# Patient Record
Sex: Female | Born: 1960 | Race: White | Hispanic: No | Marital: Married | Smoking: Former smoker
Health system: Southern US, Community
[De-identification: ages and names within clinical notes are randomized; demographics above are authoritative.]

## PROBLEM LIST (undated history)

## (undated) DIAGNOSIS — N946 Dysmenorrhea, unspecified: Secondary | ICD-10-CM

## (undated) DIAGNOSIS — M1990 Unspecified osteoarthritis, unspecified site: Secondary | ICD-10-CM

## (undated) DIAGNOSIS — G20 Parkinson's disease: Secondary | ICD-10-CM

## (undated) DIAGNOSIS — F419 Anxiety disorder, unspecified: Secondary | ICD-10-CM

## (undated) DIAGNOSIS — C029 Malignant neoplasm of tongue, unspecified: Secondary | ICD-10-CM

## (undated) DIAGNOSIS — E039 Hypothyroidism, unspecified: Secondary | ICD-10-CM

## (undated) DIAGNOSIS — M1712 Unilateral primary osteoarthritis, left knee: Secondary | ICD-10-CM

## (undated) DIAGNOSIS — G20A1 Parkinson's disease without dyskinesia, without mention of fluctuations: Secondary | ICD-10-CM

## (undated) DIAGNOSIS — S83209A Unspecified tear of unspecified meniscus, current injury, unspecified knee, initial encounter: Secondary | ICD-10-CM

## (undated) DIAGNOSIS — I10 Essential (primary) hypertension: Secondary | ICD-10-CM

## (undated) DIAGNOSIS — E7800 Pure hypercholesterolemia, unspecified: Secondary | ICD-10-CM

## (undated) HISTORY — DX: Pure hypercholesterolemia, unspecified: E78.00

## (undated) HISTORY — DX: Malignant neoplasm of tongue, unspecified: C02.9

## (undated) HISTORY — DX: Parkinson's disease without dyskinesia, without mention of fluctuations: G20.A1

## (undated) HISTORY — DX: Essential (primary) hypertension: I10

## (undated) HISTORY — DX: Anxiety disorder, unspecified: F41.9

## (undated) HISTORY — DX: Hypothyroidism, unspecified: E03.9

## (undated) HISTORY — DX: Dysmenorrhea, unspecified: N94.6

## (undated) HISTORY — PX: WISDOM TOOTH EXTRACTION: SHX21

## (undated) HISTORY — DX: Parkinson's disease: G20

## (undated) HISTORY — DX: Unspecified tear of unspecified meniscus, current injury, unspecified knee, initial encounter: S83.209A

---

## 2003-01-31 ENCOUNTER — Other Ambulatory Visit: Admission: RE | Admit: 2003-01-31 | Discharge: 2003-01-31 | Payer: Self-pay | Admitting: *Deleted

## 2004-02-06 ENCOUNTER — Other Ambulatory Visit: Admission: RE | Admit: 2004-02-06 | Discharge: 2004-02-06 | Payer: Self-pay | Admitting: *Deleted

## 2005-02-18 ENCOUNTER — Other Ambulatory Visit: Admission: RE | Admit: 2005-02-18 | Discharge: 2005-02-18 | Payer: Self-pay | Admitting: *Deleted

## 2006-03-17 ENCOUNTER — Other Ambulatory Visit: Admission: RE | Admit: 2006-03-17 | Discharge: 2006-03-17 | Payer: Self-pay | Admitting: Obstetrics & Gynecology

## 2007-03-30 ENCOUNTER — Other Ambulatory Visit: Admission: RE | Admit: 2007-03-30 | Discharge: 2007-03-30 | Payer: Self-pay | Admitting: Obstetrics and Gynecology

## 2007-05-18 ENCOUNTER — Ambulatory Visit: Payer: Self-pay | Admitting: Vascular Surgery

## 2008-04-11 ENCOUNTER — Other Ambulatory Visit: Admission: RE | Admit: 2008-04-11 | Discharge: 2008-04-11 | Payer: Self-pay | Admitting: Obstetrics and Gynecology

## 2009-10-30 ENCOUNTER — Encounter: Admission: RE | Admit: 2009-10-30 | Discharge: 2009-10-30 | Payer: Self-pay | Admitting: Gastroenterology

## 2011-02-12 NOTE — Procedures (Signed)
LOWER EXTREMITY VENOUS REFLUX EXAM   INDICATION:  Left leg varicose vein and pain.   EXAM:  Using color-flow imaging and pulse Doppler spectral analysis, the  left common femoral, superficial femoral, popliteal, posterior tibial,  greater and lesser saphenous veins are evaluated.  There is no evidence  suggesting deep venous insufficiency in the left lower extremity.   The left saphenofemoral junction is competent.  The left GSV is not  competent in the mid level with the caliber as described below.   The left proximal short saphenous vein demonstrates competency.   GSV Diameter (used if found to be incompetent only)                                            Right    Left  Proximal Greater Saphenous Vein           cm       0.58 cm  Proximal-to-mid-thigh                     cm       0.58 cm  Mid thigh                                 cm       0.36 cm  Mid-distal thigh                          cm       0.36 cm  Distal thigh                              cm       0.33 cm  Knee                                      cm       0.38 cm   IMPRESSION:  1. Left greater saphenous vein reflux is identified with a caliber      ranging from 0.38 to 0.58 cm knee to groin.  2. The left greater saphenous vein is not aneurysmal.  3. The left greater saphenous vein is not tortuous.  4. The deep system is competent.  5. The left lesser saphenous vein is competent.  6. No evidence of deep vein thrombosis noted in the left leg.   ___________________________________________  Quita Skye. Hart Rochester, M.D.   MG/MEDQ  D:  05/18/2007  T:  05/19/2007  Job:  [id number]

## 2011-02-12 NOTE — Consult Note (Signed)
VASCULAR SURGERY CONSULTATION   Abigail, Flores  DOB:  June 06, 1961                                       05/18/2007  YQMVH#:[id number]   Abigail Flores is a 50 year old female with a long history of progressive  varicose veins, particularly in the left leg, with a lesser degree on  the right side.  She has been having increasing symptoms including  throbbing, aching and burning and cramping discomfort in the left calf.  She has not worn elastic compression stockings, but occasionally will  elevate the legs with some improvement.  She does not take pain  medication on a regular basis.  She has no distal swelling or history of  thrombophlebitis, deep vein thrombosis, bleeding or ulceration, but is  having increasing discomfort in the leg which is affecting her daily  living.   PAST MEDICAL HISTORY:  Negative for diabetes, hypertension, coronary  artery disease, stroke, COPD.   PREVIOUS SURGERY:  None.   FAMILY HISTORY:  Negative for coronary artery disease and stroke, but  positive for diabetes in a grandmother.   SOCIAL HISTORY:  She is married and works actively.  She does not use  tobacco or alcohol.   ALLERGIES:  None known.   PHYSICAL EXAMINATION:  VITAL SIGNS:  Blood pressure 155/107, heart rate  is 97, respirations 16.  GENERAL:  She is a healthy-appearing middle-aged female in no apparent  distress.  She is alert and oriented x3.  NECK:  Supple with 3+ carotid pulses palpable.  No bruits are audible.  CHEST:  Clear to auscultation.  CARDIOVASCULAR:  Regular rhythm with no murmurs.  ABDOMEN:  Soft, nontender with no masses palpable.  EXTREMITIES:  She has 3+ femoral, popliteal and 2+ dorsalis pedis pulses  palpable bilaterally.  Left leg has typical greater saphenous  varicosities beginning below the knee, extending into the medical calf,  with multiple spider veins surrounding this in the distal thigh and  extending down the calf.  There is no distal edema,  no hyperpigmentation  or ulceration.  Right leg has diffuse spider veins in the thigh and calf  with no large varicosities noted.   Venous duplex exam revealed no reflux at the left saphenofemoral  junction, but some mild to moderate reflux in the mid left greater  saphenous vein communicating with these varicosities.   We will treat her initially with elastic compression stockings, pain  medications (ibuprofen) and elevation of the legs to see if this  improves her symptoms.  If not, I think the best plan would be to  proceed with primary stab phlebectomies of these varicosities with some  sclerotherapy of the secondary varicosities in the left calf.  The  saphenous vein does appear somewhat small for access, even though there  is reflux within it in the left thigh.   Abigail Flores, M.D.  Electronically Signed  JDL/MEDQ  D:  05/18/2007  T:  05/19/2007  Job:  [id number]   cc:   Abigail Flores

## 2012-09-30 HISTORY — PX: COLONOSCOPY: SHX174

## 2013-01-11 ENCOUNTER — Telehealth: Payer: Self-pay | Admitting: Certified Nurse Midwife

## 2013-01-11 NOTE — Telephone Encounter (Signed)
Patient states no menses for three months and wants to know if she should just discontinue birth control pills.  I advised patient she should have office visit first to review cycles and possibly wean off OCP to prevent menopausal symptoms. Patient reports she is already having hot flashes.  Appointment scheduled for 01-18-13, patient declined earlier appointment and to stay on OCP till appointment.  Agree?

## 2013-01-11 NOTE — Telephone Encounter (Signed)
yes

## 2013-01-11 NOTE — Telephone Encounter (Signed)
Patient wants to stop birth control pills but want to check with nurse first/[place]

## 2013-01-18 ENCOUNTER — Encounter: Payer: Self-pay | Admitting: Certified Nurse Midwife

## 2013-01-18 ENCOUNTER — Ambulatory Visit (INDEPENDENT_AMBULATORY_CARE_PROVIDER_SITE_OTHER): Payer: 59 | Admitting: Certified Nurse Midwife

## 2013-01-18 VITALS — BP 124/80 | HR 68 | Resp 16

## 2013-01-18 DIAGNOSIS — N912 Amenorrhea, unspecified: Secondary | ICD-10-CM

## 2013-01-18 DIAGNOSIS — N951 Menopausal and female climacteric states: Secondary | ICD-10-CM

## 2013-01-18 LAB — POCT URINE PREGNANCY: Preg Test, Ur: NEGATIVE

## 2013-01-18 NOTE — Progress Notes (Signed)
52 yo mwf g1p0010 here with complaint of hot flashes and night sweats off and on. Denies insomnia, vaginal dryness or weight gain.  Currently on LoLoestrin Fe for contraception with scant blood noted on placebo days. Patient not sure last time she actually had menses like bleeding. Patient came in today worried she should stop her pills. Patient hypothyroid on stable medication. "Friend said she was in menopause and frightened me" Feels well, no health issues.  O: Healthy female, WD WN Aex 05-11-12 normal, with normal pap smear,HPVHR not detected.  Last mammogram 12-13 normal UPT negative  Pelvic exam:External genital area: normal no lesions BUS: negative Urethra/urethral meatus/bladder: non tender Vagina: moist, normal appearance Cervix:normal appearance, non tender Uterus: normal, nontender Adnexa: non tender, no masses, normal Perineal area: normal no lesions   A: Perimenopausal on LoLoestrinFe with amenorrhea  Reviewed, TL  Negative UPT( patient request) Normal pelvic exam  P: Discussed etiology of perimenopause, bleeding expectations with perimenopause and with OCP use. Questions addressed at length with handout reviewed. Discussed that the LoLoestrin Fe is providing some hormonal support for changes she is experiencing, but she can stop if she desires so we can draw a FSH level. Would continued condom use for protection until result back. Discussed risks and benefits of HRT if menopausal range and desires use.  Patient would like to continue OCP at this point, feels better on.  Has aex in 7-14 will re-evaluate then unless changes.  Given information on HRT and menopause. Reassured normal pelvic exam.  30 minutes spent with patient with >50% of time spent in face to face counseling.  Rv prn and aex

## 2013-02-05 ENCOUNTER — Telehealth: Payer: Self-pay | Admitting: *Deleted

## 2013-02-05 NOTE — Telephone Encounter (Signed)
Patient called to inquire and request she would like to stop her current birth control . Lo Estrin. Seen 2 weeks ago with Dr. Hyacinth Meeker. Patient very concern is gaining weight no matter how she diets or exorcize and was hoping this might help also. Please advise. sue

## 2013-02-05 NOTE — Telephone Encounter (Signed)
Pt saw DL at last visit.  It is fine to stop her OCPs.  She needs to use condoms for birth control until she can have an FSH drawn.  I would wait at least two weeks after stopping pills before drawing the [facility]  If she is going to do this, will you let me know and I will put in the order.  The weight gain may be due to menopause and stopping the pills won't make that much difference but I am fine if she stops.  Just use condoms.

## 2013-02-05 NOTE — Telephone Encounter (Signed)
Patient notified of your response . Will talk this over with her husband and let us know on Monday what she decides to do. She mostly is concerned of the weight issue and will call back on Monday.

## 2013-02-08 ENCOUNTER — Telehealth: Payer: Self-pay | Admitting: *Deleted

## 2013-02-08 DIAGNOSIS — N951 Menopausal and female climacteric states: Secondary | ICD-10-CM

## 2013-02-08 NOTE — Telephone Encounter (Signed)
PATIENT CALLED TO GIVE ANSWER FROM PHONE CONVERSATION LAST WEEK. HAS DECIDED TO STOP CURRENT BIRTH CONTROL AFTER FINISHING THE PACK SHE HAS NOW. UNDERSTANDS TO USE CONDOMS AND AGREES. WILL CALL IN 2 WEEKS TO SCHEDULE LAB TEST FSH TO BE DONE. [name]

## 2013-02-08 NOTE — Telephone Encounter (Signed)
Order entered for [facility]

## 2013-03-15 ENCOUNTER — Telehealth: Payer: Self-pay

## 2013-03-15 NOTE — Telephone Encounter (Signed)
Patient was called to see if she had actually stopped her oc's & if so when she wanted to schedule to have [facility] checked. Patient says it has been 2 weeks since she stopped her pills. She is in [employer] right now & said she would like to have it checked at [facility] or somewhere in [place] if possible. She said as soon as she gets back that she will call us & give [place] the name & fax number as to where she would like to have the order sent

## 2013-03-22 ENCOUNTER — Ambulatory Visit (INDEPENDENT_AMBULATORY_CARE_PROVIDER_SITE_OTHER): Payer: 59 | Admitting: Obstetrics & Gynecology

## 2013-03-22 DIAGNOSIS — N951 Menopausal and female climacteric states: Secondary | ICD-10-CM

## 2013-03-22 DIAGNOSIS — Z0000 Encounter for general adult medical examination without abnormal findings: Secondary | ICD-10-CM

## 2013-03-25 LAB — FOLLICLE STIMULATING HORMONE: FSH: 49.4 m[IU]/mL

## 2013-05-12 ENCOUNTER — Encounter: Payer: Self-pay | Admitting: Certified Nurse Midwife

## 2013-05-17 ENCOUNTER — Encounter: Payer: Self-pay | Admitting: Certified Nurse Midwife

## 2013-05-17 ENCOUNTER — Ambulatory Visit (INDEPENDENT_AMBULATORY_CARE_PROVIDER_SITE_OTHER): Payer: 59 | Admitting: Certified Nurse Midwife

## 2013-05-17 VITALS — BP 120/76 | HR 68 | Resp 16 | Ht 64.5 in | Wt 190.0 lb

## 2013-05-17 DIAGNOSIS — Z1211 Encounter for screening for malignant neoplasm of colon: Secondary | ICD-10-CM

## 2013-05-17 DIAGNOSIS — Z01419 Encounter for gynecological examination (general) (routine) without abnormal findings: Secondary | ICD-10-CM

## 2013-05-17 DIAGNOSIS — Z0000 Encounter for general adult medical examination without abnormal findings: Secondary | ICD-10-CM

## 2013-05-17 LAB — POCT URINALYSIS DIPSTICK
Bilirubin, UA: NEGATIVE
Ketones, UA: NEGATIVE
Nitrite, UA: NEGATIVE
Protein, UA: NEGATIVE
pH, UA: 5

## 2013-05-17 NOTE — Patient Instructions (Signed)
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
## 2013-05-20 NOTE — Progress Notes (Signed)
Note reviewed, agree with plan.  Cache Bills, MD  

## 2013-06-11 ENCOUNTER — Other Ambulatory Visit: Payer: Self-pay | Admitting: Certified Nurse Midwife

## 2013-06-11 ENCOUNTER — Telehealth: Payer: Self-pay

## 2013-06-11 DIAGNOSIS — Z1211 Encounter for screening for malignant neoplasm of colon: Secondary | ICD-10-CM

## 2013-06-11 LAB — FECAL OCCULT BLOOD, IMMUNOCHEMICAL: Fecal Occult Blood: NEGATIVE

## 2013-06-11 NOTE — Telephone Encounter (Signed)
IFOB test neg. Pt notified

## 2013-06-11 NOTE — Addendum Note (Signed)
Addended by: Eliezer Bottom on: 06/11/2013 10:59 AM   Modules accepted: Orders

## 2014-05-23 ENCOUNTER — Ambulatory Visit: Payer: 59 | Admitting: Certified Nurse Midwife

## 2014-06-20 ENCOUNTER — Encounter: Payer: Self-pay | Admitting: Certified Nurse Midwife

## 2014-06-20 ENCOUNTER — Ambulatory Visit (INDEPENDENT_AMBULATORY_CARE_PROVIDER_SITE_OTHER): Payer: 59 | Admitting: Certified Nurse Midwife

## 2014-06-20 VITALS — BP 118/80 | HR 70 | Resp 16 | Ht 64.25 in | Wt 192.0 lb

## 2014-06-20 DIAGNOSIS — Z0000 Encounter for general adult medical examination without abnormal findings: Secondary | ICD-10-CM

## 2014-06-20 DIAGNOSIS — E039 Hypothyroidism, unspecified: Secondary | ICD-10-CM | POA: Insufficient documentation

## 2014-06-20 DIAGNOSIS — Z01419 Encounter for gynecological examination (general) (routine) without abnormal findings: Secondary | ICD-10-CM

## 2014-06-20 DIAGNOSIS — I10 Essential (primary) hypertension: Secondary | ICD-10-CM | POA: Insufficient documentation

## 2014-06-20 DIAGNOSIS — Z124 Encounter for screening for malignant neoplasm of cervix: Secondary | ICD-10-CM

## 2014-06-20 LAB — POCT URINALYSIS DIPSTICK
BILIRUBIN UA: NEGATIVE
Glucose, UA: NEGATIVE
Ketones, UA: NEGATIVE
Leukocytes, UA: NEGATIVE
Nitrite, UA: NEGATIVE
PH UA: 5
PROTEIN UA: NEGATIVE
RBC UA: NEGATIVE
Urobilinogen, UA: NEGATIVE

## 2014-06-20 NOTE — Patient Instructions (Signed)

## 2014-06-20 NOTE — Progress Notes (Signed)
Reviewed personally.  M. Suzanne Akeen Ledyard, MD.  
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
## 2014-06-22 LAB — IPS PAP TEST WITH REFLEX TO HPV

## 2014-08-01 ENCOUNTER — Encounter: Payer: Self-pay | Admitting: Certified Nurse Midwife

## 2014-09-30 HISTORY — PX: LASIK: SHX215

## 2015-06-26 ENCOUNTER — Ambulatory Visit (INDEPENDENT_AMBULATORY_CARE_PROVIDER_SITE_OTHER): Payer: 59 | Admitting: Nurse Practitioner

## 2015-06-26 ENCOUNTER — Ambulatory Visit: Payer: 59 | Admitting: Certified Nurse Midwife

## 2015-06-26 ENCOUNTER — Encounter: Payer: Self-pay | Admitting: Nurse Practitioner

## 2015-06-26 VITALS — BP 136/90 | HR 84 | Ht 64.25 in | Wt 195.0 lb

## 2015-06-26 DIAGNOSIS — Z0000 Encounter for general adult medical examination without abnormal findings: Secondary | ICD-10-CM

## 2015-06-26 DIAGNOSIS — Z01419 Encounter for gynecological examination (general) (routine) without abnormal findings: Secondary | ICD-10-CM

## 2015-06-26 LAB — POCT URINALYSIS DIPSTICK
Bilirubin, UA: NEGATIVE
Blood, UA: NEGATIVE
GLUCOSE UA: NEGATIVE
KETONES UA: NEGATIVE
Leukocytes, UA: NEGATIVE
Nitrite, UA: NEGATIVE
Protein, UA: NEGATIVE
Urobilinogen, UA: NEGATIVE
pH, UA: 6
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
## 2015-06-26 NOTE — Patient Instructions (Signed)

## 2015-06-29 NOTE — Progress Notes (Signed)
Encounter reviewed by Dr. Brook Amundson C. Silva.  

## 2016-07-01 ENCOUNTER — Telehealth: Payer: Self-pay | Admitting: Nurse Practitioner

## 2016-07-01 ENCOUNTER — Ambulatory Visit: Payer: 59 | Admitting: Nurse Practitioner

## 2016-07-01 NOTE — Telephone Encounter (Signed)
Called but no voicemail set up to to reschedule an appointment today that was cancelled by the provider.

## 2016-07-22 ENCOUNTER — Ambulatory Visit: Payer: 59 | Admitting: Nurse Practitioner

## 2016-07-22 ENCOUNTER — Encounter: Payer: Self-pay | Admitting: Nurse Practitioner

## 2016-07-22 VITALS — BP 112/76 | HR 84 | Ht 65.0 in | Wt 185.0 lb

## 2016-07-22 DIAGNOSIS — Z0000 Encounter for general adult medical examination without abnormal findings: Secondary | ICD-10-CM

## 2016-07-22 DIAGNOSIS — Z01419 Encounter for gynecological examination (general) (routine) without abnormal findings: Secondary | ICD-10-CM | POA: Diagnosis not present

## 2016-07-22 LAB — POCT URINALYSIS DIPSTICK
BILIRUBIN UA: NEGATIVE
Glucose, UA: NEGATIVE
KETONES UA: NEGATIVE
LEUKOCYTES UA: NEGATIVE
Nitrite, UA: NEGATIVE
PH UA: 5
Protein, UA: NEGATIVE
RBC UA: NEGATIVE
Urobilinogen, UA: NEGATIVE
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
## 2016-07-22 NOTE — Patient Instructions (Signed)

## 2016-07-26 NOTE — Progress Notes (Signed)
Encounter reviewed by Dr. Brook Amundson C. Silva.  

## 2016-12-04 ENCOUNTER — Telehealth: Payer: Self-pay | Admitting: Certified Nurse Midwife

## 2016-12-05 ENCOUNTER — Encounter: Payer: Self-pay | Admitting: Certified Nurse Midwife

## 2016-12-05 ENCOUNTER — Telehealth: Payer: Self-pay | Admitting: Certified Nurse Midwife

## 2016-12-05 ENCOUNTER — Ambulatory Visit (INDEPENDENT_AMBULATORY_CARE_PROVIDER_SITE_OTHER): Payer: 59 | Admitting: Certified Nurse Midwife

## 2016-12-05 VITALS — BP 118/80 | HR 64 | Temp 97.6°F | Resp 16 | Ht 65.0 in | Wt 183.0 lb

## 2016-12-05 DIAGNOSIS — R319 Hematuria, unspecified: Secondary | ICD-10-CM | POA: Diagnosis not present

## 2016-12-05 DIAGNOSIS — B373 Candidiasis of vulva and vagina: Secondary | ICD-10-CM

## 2016-12-05 DIAGNOSIS — B3731 Acute candidiasis of vulva and vagina: Secondary | ICD-10-CM

## 2016-12-05 DIAGNOSIS — N390 Urinary tract infection, site not specified: Secondary | ICD-10-CM

## 2016-12-05 LAB — POCT URINALYSIS DIPSTICK
Bilirubin, UA: NEGATIVE
Glucose, UA: NEGATIVE
KETONES UA: NEGATIVE
Nitrite, UA: NEGATIVE
PROTEIN UA: NEGATIVE
UROBILINOGEN UA: NEGATIVE
pH, UA: 5

## 2016-12-05 MED ORDER — TERCONAZOLE 0.4 % VA CREA: 1.0000 | TOPICAL_CREAM | Freq: Every day | VAGINAL | 0 refills | Status: DC

## 2016-12-05 MED ORDER — PHENAZOPYRIDINE HCL 100 MG PO TABS: 100.0000 mg | ORAL_TABLET | Freq: Three times a day (TID) | ORAL | 0 refills | Status: DC | PRN

## 2016-12-05 NOTE — Telephone Encounter (Signed)
Her exam did not reveal any CVAT tenderness related to UTI. Will wait on urine micro to advise

## 2016-12-05 NOTE — Progress Notes (Signed)
Encounter reviewed Tyon Cerasoli, MD   

## 2016-12-05 NOTE — Telephone Encounter (Signed)
Spoke with patient, advised as seen below per Melvia Heaps, CNM. Patient states she has now taken 2 pyridium and is getting relief of symptoms. Patient verbalizes understanding and is agreeable.  Routing to provider for final review. Patient is agreeable to disposition. Will close encounter.

## 2016-12-05 NOTE — Telephone Encounter (Signed)
Spoke with patient. Patient states she was in office today for UTI symptoms. Patient states she forgot to mention to Melvia Heaps, CNM that she has also been experiencing lower back pain. Patient states the lower back pain started 3 weeks ago when urinary symptoms started. Patient denies fever, nausea or vomiting. Patient states she has started the pyridium. Advised patient RN would update Melvia Heaps, CNM and return call with any additional recommendations.   Melvia Heaps, CNM -please advise?

## 2016-12-05 NOTE — Progress Notes (Signed)
57 y.o. [name] G1P0010 here with complaint of UTI that was treated with Macrobid per PCP in [place] x one week,felt better and then sent specimen to PCP and specimen was negative per PCP.  New onset symptoms of burning 24 hours ago.  Patient complaining of urinary frequency/urgency/ and pain with urination. Patient denies fever, chills, nausea or back pain.Patient used douche vinegar yesterday and felt some better.. Patient feels not related to sexual activity.Some vaginal dryness.  Not drinking enough fluids mainly water. No other health issues. Has lost 18 pounds and wonders if this caused this.  ROS pertinent to HPI  O: Healthy female WDWN Affect: Normal, orientation x 3 Skin : warm and dry CVAT: negative bilateral Abdomen: negative for suprapubic tenderness  Pelvic exam: External genital area: normal, no lesions slight dryness noted Bladder,Urethra tender very slightly tender, Urethral meatus: tender,with increase pink Vagina: thick white non odorousl vaginal discharge, slight atrophic appearance  Wet prep taken, ph 4.0 Cervix: normal, non tender Uterus:normal,non tender Adnexa: normal non tender, no fullness or masses   A: Normal pelvic exam Yeast vaginitis R/O UTI, treated 2 weeks ago with Macrobid  Wet Prep: Koh, Saline + for yeast only poct urine- wbc 1+, rbc tr  P: Reviewed findings of yeast vaginitis and need for treatment. [id number] with instructions Discussed vaginal dryness can also increase risk of vaginal infection and UTI HKU:VJDYN micro, culture Reviewed warning signs and symptoms of UTI and need to advise if occurring.Encouraged to limit soda, tea, and coffee and be sure to increase water intake. Rx Pyridium see order with instructions Will treat if indicated.   RV prn

## 2016-12-05 NOTE — Telephone Encounter (Signed)
Patient called and requested to speak with the nurse. She was seen this morning and said, "I forgot to ask about my back pain at my visit this morning.  Could that be related with what's going on with me right now?"

## 2016-12-05 NOTE — Patient Instructions (Addendum)
Vaginal Yeast infection, Adult Vaginal yeast infection is a condition that causes soreness, swelling, and redness (inflammation) of the vagina. It also causes vaginal discharge. This is a common condition. Some women get this infection frequently. What are the causes? This condition is caused by a change in the normal balance of the yeast (candida) and bacteria that live in the vagina. This change causes an overgrowth of yeast, which causes the inflammation. What increases the risk? This condition is more likely to develop in:  Women who take antibiotic medicines.  Women who have diabetes.  Women who take birth control pills.  Women who are pregnant.  Women who douche often.  Women who have a weak defense (immune) system.  Women who have been taking steroid medicines for a long time.  Women who frequently wear tight clothing. What are the signs or symptoms? Symptoms of this condition include:  White, thick vaginal discharge.  Swelling, itching, redness, and irritation of the vagina. The lips of the vagina (vulva) may be affected as well.  Pain or a burning feeling while urinating.  Pain during sex. How is this diagnosed? This condition is diagnosed with a medical history and physical exam. This will include a pelvic exam. Your health care provider will examine a sample of your vaginal discharge under a microscope. Your health care provider may send this sample for testing to confirm the diagnosis. How is this treated? This condition is treated with medicine. Medicines may be over-the-counter or prescription. You may be told to use one or more of the following:  Medicine that is taken orally.  Medicine that is applied as a cream.  Medicine that is inserted directly into the vagina (suppository). Follow these instructions at home:  Take or apply over-the-counter and prescription medicines only as told by your health care provider.  Do not have sex until your health care  provider has approved. Tell your sex partner that you have a yeast infection. That person should go to his or her health care provider if he or she develops symptoms.  Do not wear tight clothes, such as pantyhose or tight pants.  Avoid using tampons until your health care provider approves.  Eat more yogurt. This may help to keep your yeast infection from returning.  Try taking a sitz bath to help with discomfort. This is a warm water bath that is taken while you are sitting down. The water should only come up to your hips and should cover your buttocks. Do this 3-4 times per day or as told by your health care provider.  Do not douche.  Wear breathable, cotton underwear.  If you have diabetes, keep your blood sugar levels under control. Contact a health care provider if:  You have a fever.  Your symptoms go away and then return.  Your symptoms do not get better with treatment.  Your symptoms get worse.  You have new symptoms.  You develop blisters in or around your vagina.  You have blood coming from your vagina and it is not your menstrual period.  You develop pain in your abdomen. This information is not intended to replace advice given to you by your health care provider. Make sure you discuss any questions you have with your health care provider. Document Released: 06/26/2005 Document Revised: 02/28/2016 Document Reviewed: 03/20/2015 Elsevier Interactive Patient Education  2017 [employer] Urinary Tract Infection, Adult A urinary tract infection (UTI) is an infection of any part of the urinary tract. The urinary tract includes the:  Kidneys.  Ureters.  Bladder.  Urethra. These organs make, store, and get rid of pee (urine) in the body. Follow these instructions at home:  Take over-the-counter and prescription medicines only as told by your doctor.  If you were prescribed an antibiotic medicine, take it as told by your doctor. Do not stop taking the antibiotic  even if you start to feel better.  Avoid the following drinks:  Alcohol.  Caffeine.  Tea.  Carbonated drinks.  Drink enough fluid to keep your pee clear or pale yellow.  Keep all follow-up visits as told by your doctor. This is important.  Make sure to:  Empty your bladder often and completely. Do not to hold pee for long periods of time.  Empty your bladder before and after sex.  Wipe from front to back after a bowel movement if you are female. Use each tissue one time when you wipe. Contact a doctor if:  You have back pain.  You have a fever.  You feel sick to your stomach (nauseous).  You throw up (vomit).  Your symptoms do not get better after 3 days.  Your symptoms go away and then come back. Get help right away if:  You have very bad back pain.  You have very bad lower belly (abdominal) pain.  You are throwing up and cannot keep down any medicines or water. This information is not intended to replace advice given to you by your health care provider. Make sure you discuss any questions you have with your health care provider. Document Released: 03/04/2008 Document Revised: 02/22/2016 Document Reviewed: 08/07/2015 Elsevier Interactive Patient Education  2017 [employer].

## 2016-12-06 LAB — URINALYSIS, MICROSCOPIC ONLY
CRYSTALS: NONE SEEN [HPF]
Casts: NONE SEEN [LPF]
YEAST: NONE SEEN [HPF]

## 2016-12-07 LAB — URINE CULTURE

## 2016-12-09 ENCOUNTER — Other Ambulatory Visit: Payer: Self-pay | Admitting: Nurse Practitioner

## 2016-12-09 MED ORDER — CIPROFLOXACIN HCL 500 MG PO TABS: 500.0000 mg | ORAL_TABLET | Freq: Two times a day (BID) | ORAL | 0 refills | Status: DC

## 2016-12-09 NOTE — Progress Notes (Signed)
cipro

## 2016-12-12 ENCOUNTER — Telehealth: Payer: Self-pay

## 2016-12-12 NOTE — Telephone Encounter (Signed)
Spoke with patient. See telephone encounter for 12-12-16

## 2016-12-12 NOTE — Telephone Encounter (Signed)
-----   Message from Regina Eck, CNM sent at 12/12/2016  8:20 AM EDT ----- Please call and check on patient UTI status due to reoccurrence from previous treatment with PCP

## 2016-12-12 NOTE — Telephone Encounter (Signed)
Pt unable to talk to me & asked me to call her back later.

## 2016-12-17 ENCOUNTER — Other Ambulatory Visit: Payer: Self-pay | Admitting: Nurse Practitioner

## 2016-12-17 NOTE — Telephone Encounter (Signed)
Medication refill request: Ciprofloxacin Last AEX:  07/22/16 PG Next AEX: 07/28/17 PG Last MMG (if hormonal medication request): 02/12/16 [id number], Density B, [facility] Refill authorized: 12/09/16 #14 0R. Spoke with patient. She states she got off schedule with medication, but has taken it. Still has some burning in lower area. Patient would like to see if another round of antibiotics will work.

## 2016-12-17 NOTE — Telephone Encounter (Signed)
Please call her and get information about how she is doing and if needs a repeat OV with DL or does she have vaginitis symptoms.

## 2016-12-18 ENCOUNTER — Telehealth: Payer: Self-pay | Admitting: Certified Nurse Midwife

## 2016-12-18 MED ORDER — FLUCONAZOLE 150 MG PO TABS: ORAL_TABLET | ORAL | 0 refills | Status: DC

## 2016-12-18 NOTE — Telephone Encounter (Signed)
Spoke with patient, advised as seen below per Melvia Heaps, CNM. Order placed for Diflucan at verified pharmacy on file. Patient verbalizes understanding and is agreeable.  Routing to provider for final review. Patient is agreeable to disposition. Will close encounter.

## 2016-12-18 NOTE — Telephone Encounter (Signed)
If she was not able to use Terazol she probably still has yeast, which is contributing symptoms.  She needs Diflucan 150 mg  Take one and repeat one in 5 days.. Will not give repeat antibiotics with out OV. But symptoms sound more vaginal, which is why I feel the Diflucan will help.

## 2016-12-18 NOTE — Telephone Encounter (Signed)
Spoke with patient. Patient asking to clarify directions for Diflucan. Advised patient to take one pill now, take second pill in 5 days. Patient verbalizes understanding and is agreeable.  Routing to provider for final review. Patient is agreeable to disposition. Will close encounter.

## 2016-12-18 NOTE — Telephone Encounter (Signed)
Spoke with patient. Patient states she is still having slight discomfort inside vagina, low abdomen. Patient states she is unable to describe the location, inside. Patient reports discomfort is better, but still there. Patient states she was hoping another round of antibiotics would help. Patient denies vaginal dryness, itching or discharge. Patient reports urinary frequency with small amounts of urine. Patient states she was unable to complete Terazol, too painful to insert. Patient denies fever, reports a little lower back pain, not as bad, hard to tell this early in day. Recommended OV for further evaluation, patient declined. Patient states only day off is Mondays and my husband just had knee surgery, I don't know when I could come in. Advised patient would update Abigail Flores, CNM and return call with any additional recommendations. Patient is agreeable.  Abigail Flores, CNM -any additional recommendations?  Cc: Kem Boroughs, NP

## 2016-12-18 NOTE — Telephone Encounter (Signed)
Patient called with more questions about her recent medication refill.

## 2017-02-17 ENCOUNTER — Encounter: Payer: Self-pay | Admitting: Obstetrics and Gynecology

## 2017-03-07 ENCOUNTER — Other Ambulatory Visit: Payer: Self-pay | Admitting: Certified Nurse Midwife

## 2017-03-07 ENCOUNTER — Telehealth: Payer: Self-pay | Admitting: *Deleted

## 2017-03-07 DIAGNOSIS — R319 Hematuria, unspecified: Principal | ICD-10-CM

## 2017-03-07 DIAGNOSIS — N390 Urinary tract infection, site not specified: Secondary | ICD-10-CM

## 2017-03-07 NOTE — Telephone Encounter (Addendum)
Medication refill request: pyridium  Last AEX:  07/22/16 DL Next AEX: 07/28/17 PG Last MMG (if hormonal medication request): 02/12/16 [id number]:neg  Refill authorized: 12/05/16 #10/0R. Today please advise.

## 2017-03-07 NOTE — Telephone Encounter (Signed)
Refill request received from pharmacy for pyridium. Unable to refill without evaluation per Melvia Heaps, CNM. Patient was just seen for UTI 11-2016. Call to patient. Reports frequency with minimal urine flow, and back pain for three days. Denies fever. Discussed recommendation to be evaluated at urgent care facility. Patient prefers to wait to se PCP on Monday. Advised that with back pain, this is not recommended, risk for progression to pyelo. Precautions given.  States back pain slightly better today. Encouraged to proceed to urgent care or [facility] if symptoms continue but if worsening back pain or fever should go to ED.  Encouraged to have notes sent to our office.   Routing to provider for final review. Patient agreeable to disposition. Will close encounter.

## 2017-03-17 ENCOUNTER — Ambulatory Visit (INDEPENDENT_AMBULATORY_CARE_PROVIDER_SITE_OTHER): Payer: 59 | Admitting: Nurse Practitioner

## 2017-03-17 ENCOUNTER — Encounter: Payer: Self-pay | Admitting: Nurse Practitioner

## 2017-03-17 VITALS — BP 136/82 | HR 68 | Temp 98.2°F | Ht 65.0 in | Wt 180.0 lb

## 2017-03-17 DIAGNOSIS — R300 Dysuria: Secondary | ICD-10-CM | POA: Diagnosis not present

## 2017-03-17 LAB — POCT URINALYSIS DIPSTICK
BILIRUBIN UA: NEGATIVE
GLUCOSE UA: NEGATIVE
Ketones, UA: NEGATIVE
NITRITE UA: NEGATIVE
UROBILINOGEN UA: 0.2 U/dL
pH, UA: 5 (ref 5.0–8.0)

## 2017-03-17 MED ORDER — SULFAMETHOXAZOLE-TRIMETHOPRIM 800-160 MG PO TABS: 1.0000 | ORAL_TABLET | Freq: Two times a day (BID) | ORAL | 0 refills | Status: DC

## 2017-03-17 NOTE — Progress Notes (Signed)
Reviewed personally.  M. Suzanne Jisselle Poth, MD.  

## 2017-03-17 NOTE — Patient Instructions (Signed)
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
## 2017-03-18 LAB — URINE CULTURE

## 2017-03-18 LAB — URINALYSIS, MICROSCOPIC ONLY
Bacteria, UA: NONE SEEN
Casts: NONE SEEN /lpf

## 2017-03-19 ENCOUNTER — Telehealth: Payer: Self-pay | Admitting: Nurse Practitioner

## 2017-03-19 NOTE — Telephone Encounter (Signed)
Patient returning your call.

## 2017-03-19 NOTE — Telephone Encounter (Signed)
Pt notified in result note.  Closing encounter. 

## 2017-03-20 ENCOUNTER — Telehealth: Payer: Self-pay | Admitting: Nurse Practitioner

## 2017-03-20 NOTE — Telephone Encounter (Signed)
Please call the patient back tomorrow to see if she is feeling better.

## 2017-03-20 NOTE — Telephone Encounter (Signed)
Stop the Bactrim.  There is a rare side effect of the Bactrim called rhabdomyolysis, muscle breakdown.  If she is experiencing severe pain, she needs to go to the emergency department for further evaluation including blood work and urine testing.  This is important to know if this is her diagnosis so she can receive proper care.  Please list the Bactrim as an allergy.

## 2017-03-20 NOTE — Telephone Encounter (Signed)
Spoke with patient, advised as seen below per Dr. Quincy Simmonds. Patient states she does not feel like the muscle pain is that bad to go to ER. RN strongly encouraged patient to f/u with ER for further evaluation of symptoms for appropriate diagnosis and treatment. Patient states she is thankful for recommendations and will f/u with any additional questions/concerns. Patient verbalizes understanding.  Allergies updated.  Dr. Quincy Simmonds -please review, any additional f/u?

## 2017-03-20 NOTE — Telephone Encounter (Signed)
Patient has a question for the nurse °

## 2017-03-20 NOTE — Telephone Encounter (Signed)
Spoke with patient. Patient states she was prescribed Bactrim DS bid x3 days for UTI on 6/18. Patient states she is on day 3, has one pill left to take this evening, completed Macrobid prior to [place]. Patient states she is experiencing excruciating muscle pain from antibiotic and would prefer not to take last dose. Patient states urinary symptoms have resolved. Advised patient Kem Boroughs, NP is out of the office today, will review with covering provider and return call with recommendations, patient is agreeable.   Dr. Quincy Simmonds -please review and advise?  Cc: Kem Boroughs, NP

## 2017-03-21 NOTE — Telephone Encounter (Signed)
Spoke with patient. Patient states she feels 100% better today, no muscle aches. Patient thankful for f/u call.   Routing to provider for final review. Patient is agreeable to disposition. Will close encounter.

## 2017-03-21 NOTE — Telephone Encounter (Signed)
Left message to call Kamara Allan at [phone].  

## 2017-07-28 ENCOUNTER — Ambulatory Visit: Payer: 59 | Admitting: Nurse Practitioner

## 2017-08-11 ENCOUNTER — Encounter: Payer: Self-pay | Admitting: Obstetrics and Gynecology

## 2017-08-11 ENCOUNTER — Ambulatory Visit: Payer: 59 | Admitting: Obstetrics and Gynecology

## 2017-08-11 VITALS — BP 130/80 | HR 84 | Resp 12 | Ht 63.25 in | Wt 182.0 lb

## 2017-08-11 DIAGNOSIS — Z01419 Encounter for gynecological examination (general) (routine) without abnormal findings: Secondary | ICD-10-CM

## 2017-08-11 DIAGNOSIS — E039 Hypothyroidism, unspecified: Secondary | ICD-10-CM | POA: Insufficient documentation

## 2017-08-11 DIAGNOSIS — Z124 Encounter for screening for malignant neoplasm of cervix: Secondary | ICD-10-CM

## 2017-08-11 DIAGNOSIS — E038 Other specified hypothyroidism: Secondary | ICD-10-CM

## 2017-08-11 NOTE — Patient Instructions (Signed)
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
## 2017-08-13 ENCOUNTER — Other Ambulatory Visit (HOSPITAL_COMMUNITY)
Admission: RE | Admit: 2017-08-13 | Discharge: 2017-08-13 | Disposition: A | Discharge status: Home / Self Care | Payer: 59 | Source: Ambulatory Visit | Attending: Obstetrics and Gynecology | Admitting: Obstetrics and Gynecology

## 2017-08-13 DIAGNOSIS — Z124 Encounter for screening for malignant neoplasm of cervix: Secondary | ICD-10-CM | POA: Insufficient documentation

## 2017-08-13 NOTE — Addendum Note (Signed)
Addended by: Dorothy Spark on: 08/13/2017 05:06 PM   Modules accepted: Orders

## 2017-08-15 LAB — CYTOLOGY - PAP
DIAGNOSIS: NEGATIVE
HPV: NOT DETECTED

## 2017-09-22 ENCOUNTER — Other Ambulatory Visit: Payer: Self-pay | Admitting: Orthopedic Surgery

## 2017-09-22 NOTE — Pre-Procedure Instructions (Signed)
PAILYNN VAHEY  09/22/2017      [id number] [place] [id number] Phone: [phone] Fax: [phone]    Your procedure is scheduled on Tuesday, January 8.  Report to [facility] Admitting at 11:15  A.M.               Your surgery or procedure is scheduled for 1:15 PM   Call this number if you have problems the morning of surgery: [phone]  - pre- op desk              For any other questions, please call [phone], Monday - Friday 8 AM - 4 PM. (except Jan 1st)            Remember:            DO NOT eat food or drink liquids after midnight Monday, January 8.               Take these medications with A SIP OF WATER: amLODipine (NORVASC) levothyroxine (SYNTHROID, LEVOTHROID)  May take HYDROcodone-acetaminophen (NORCO/VICODIN) if needed.  1 Week prior to surgery DO NOT  Take Aspirin, Aspirin Products (Goody Powder, Excedrin Migraine), Ibuprofen (Advil), Naproxen (Aleve), Viiamins and Herbal Products (ie Fish Oil)  Special instructions:   [facility]- Preparing For Surgery  Before surgery, you can play an important role. Because skin is not sterile, your skin needs to be as free of germs as possible. You can reduce the number of germs on your skin by washing with CHG (chlorahexidine gluconate) Soap before surgery.  CHG is an antiseptic cleaner which kills germs and bonds with the skin to continue killing germs even after washing.  Please do not use if you have an allergy to CHG or antibacterial soaps. If your skin becomes reddened/irritated stop using the CHG.  Do not shave (including legs and underarms) for at least 48 hours prior to first CHG shower. It is OK to shave your face.  Please follow these instructions carefully.   1. Shower the NIGHT BEFORE SURGERY and the MORNING OF SURGERY with CHG.   2. If you chose to wash your hair, wash your hair first as usual with your normal shampoo.  3. After  you shampoo, rinse your hair and body thoroughly to remove the shampoo.      Wash your face and private area with the soap you use at home, then rinse.  4.    Use CHG as you would any other liquid soap. You can apply CHG directly to the skin and wash gently with a scrungie or a clean washcloth.   5. Apply the CHG Soap to your body ONLY FROM THE NECK DOWN.  Do not use on open wounds or open sores. Avoid contact with your eyes, ears, mouth and genitals (private parts). Wash Face and genitals (private parts)  with your normal soap.  6. Wash thoroughly, paying special attention to the area where your surgery will be performed.  7. Thoroughly rinse your body with warm water from the neck down.  8. DO NOT shower/wash with your normal soap after using and rinsing off the CHG Soap.  9. Pat yourself dry with a CLEAN TOWEL.  10. Wear CLEAN PAJAMAS to bed the night before surgery, wear comfortable clothes the morning of surgery  11. Place CLEAN SHEETS on your bed the night of your first shower and DO NOT SLEEP WITH PETS.  Day of Surgery:   Shower as above Do not apply any deodorants/lotions, powders or colognes. Please wear clean clothes to the hospital/surgery center.  Do not wear jewelry, make-up or nail polish.  Do not shave 48 hours prior to surgery.  Men may shave face and neck.  Do not bring valuables to the hospital.  [facility] is not responsible for any belongings or valuables.  Contacts, dentures or bridgework may not be worn into surgery.  Leave your suitcase in the car.  After surgery it may be brought to your room.  For patients admitted to the hospital, discharge time will be determined by your treatment team.  Patients discharged the day of surgery will not be allowed to drive home.   Please read over the following fact sheets that you were given: Pain Booklet, Patient Instructions for Mupirocin Application,COughing and Deep Breathing, Surgical Site Infections.

## 2017-09-24 ENCOUNTER — Other Ambulatory Visit: Payer: Self-pay

## 2017-09-24 ENCOUNTER — Encounter (HOSPITAL_COMMUNITY)
Admission: RE | Admit: 2017-09-24 | Discharge: 2017-09-24 | Disposition: A | Discharge status: Home / Self Care | Payer: 59 | Source: Ambulatory Visit | Attending: Orthopedic Surgery | Admitting: Orthopedic Surgery

## 2017-09-24 ENCOUNTER — Encounter (HOSPITAL_COMMUNITY): Payer: Self-pay

## 2017-09-24 DIAGNOSIS — M13862 Other specified arthritis, left knee: Secondary | ICD-10-CM | POA: Insufficient documentation

## 2017-09-24 DIAGNOSIS — Z01810 Encounter for preprocedural cardiovascular examination: Secondary | ICD-10-CM | POA: Insufficient documentation

## 2017-09-24 DIAGNOSIS — Z01812 Encounter for preprocedural laboratory examination: Secondary | ICD-10-CM | POA: Diagnosis not present

## 2017-09-24 HISTORY — DX: Unspecified osteoarthritis, unspecified site: M19.90

## 2017-09-24 LAB — CBC
HCT: 39.5 % (ref 36.0–46.0)
HEMOGLOBIN: 12.9 g/dL (ref 12.0–15.0)
MCH: 26.8 pg (ref 26.0–34.0)
MCHC: 32.7 g/dL (ref 30.0–36.0)
MCV: 82.1 fL (ref 78.0–100.0)
Platelets: 403 10*3/uL — ABNORMAL HIGH (ref 150–400)
RBC: 4.81 MIL/uL (ref 3.87–5.11)
RDW: 14.1 % (ref 11.5–15.5)
WBC: 6.4 10*3/uL (ref 4.0–10.5)

## 2017-09-24 LAB — BASIC METABOLIC PANEL
ANION GAP: 7 (ref 5–15)
BUN: 10 mg/dL (ref 6–20)
CALCIUM: 9.8 mg/dL (ref 8.9–10.3)
CO2: 26 mmol/L (ref 22–32)
Chloride: 104 mmol/L (ref 101–111)
Creatinine, Ser: 0.61 mg/dL (ref 0.44–1.00)
GLUCOSE: 95 mg/dL (ref 65–99)
Potassium: 4.3 mmol/L (ref 3.5–5.1)
Sodium: 137 mmol/L (ref 135–145)

## 2017-09-24 LAB — SURGICAL PCR SCREEN
MRSA, PCR: NEGATIVE
STAPHYLOCOCCUS AUREUS: NEGATIVE

## 2017-09-24 NOTE — Pre-Procedure Instructions (Signed)
JONITA HIROTA  09/24/2017      [id number] [place] - [place] [address] Phone: [phone] Fax: [phone]    Your procedure is scheduled on Tuesday, January 8.  Report to [facility] Admitting at 11:15  A.M.               Your surgery or procedure is scheduled for 1:15 PM   Call this number if you have problems the morning of surgery: [phone]  - pre- op desk              For any other questions, please call [phone], Monday - Friday 8 AM - 4 PM. (except Jan 1st)            Remember:            DO NOT eat food or drink liquids after midnight Monday, January 8.               Take these medications with A SIP OF WATER: amLODipine (NORVASC) levothyroxine (SYNTHROID, LEVOTHROID)  May take HYDROcodone-acetaminophen (NORCO/VICODIN) if needed.  1 Week prior to surgery DO NOT  Take Aspirin, Aspirin Products (Goody Powder, Excedrin Migraine), Ibuprofen (Advil), Naproxen (Aleve), Viiamins and Herbal Products (ie Fish Oil)  Special instructions:   [facility]- Preparing For Surgery  Before surgery, you can play an important role. Because skin is not sterile, your skin needs to be as free of germs as possible. You can reduce the number of germs on your skin by washing with CHG (chlorahexidine gluconate) Soap before surgery.  CHG is an antiseptic cleaner which kills germs and bonds with the skin to continue killing germs even after washing.  Please do not use if you have an allergy to CHG or antibacterial soaps. If your skin becomes reddened/irritated stop using the CHG.  Do not shave (including legs and underarms) for at least 48 hours prior to first CHG shower. It is OK to shave your face.  Please follow these instructions carefully.   1. Shower the NIGHT BEFORE SURGERY and the MORNING OF SURGERY with CHG.   2. If you chose to wash your hair, wash your hair first as usual with your normal shampoo.  3. After  you shampoo, rinse your hair and body thoroughly to remove the shampoo.      Wash your face and private area with the soap you use at home, then rinse.  4.    Use CHG as you would any other liquid soap. You can apply CHG directly to the skin and wash gently with a scrungie or a clean washcloth.   5. Apply the CHG Soap to your body ONLY FROM THE NECK DOWN.  Do not use on open wounds or open sores. Avoid contact with your eyes, ears, mouth and genitals (private parts). Wash Face and genitals (private parts)  with your normal soap.  6. Wash thoroughly, paying special attention to the area where your surgery will be performed.  7. Thoroughly rinse your body with warm water from the neck down.  8. DO NOT shower/wash with your normal soap after using and rinsing off the CHG Soap.  9. Pat yourself dry with a CLEAN TOWEL.  10. Wear CLEAN PAJAMAS to bed the night before surgery, wear comfortable clothes the morning of surgery  11. Place CLEAN SHEETS on your bed the night of your first shower and DO NOT SLEEP WITH PETS.  Day of Surgery:   Shower as above Do not apply any deodorants/lotions, powders or colognes. Please wear clean clothes to the hospital/surgery center.  Do not wear jewelry, make-up or nail polish.  Do not shave 48 hours prior to surgery.    Do not bring valuables to the hospital.  [facility] is not responsible for any belongings or valuables.  Contacts, dentures or bridgework may not be worn into surgery.  Leave your suitcase in the car.  After surgery it may be brought to your room.  For patients admitted to the hospital, discharge time will be determined by your treatment team.  Patients discharged the day of surgery will not be allowed to drive home.   Please read over the following fact sheets that you were given: Pain Booklet, Patient Instructions for Mupirocin Application,COughing and Deep Breathing, Surgical Site Infections.

## 2017-09-26 ENCOUNTER — Other Ambulatory Visit: Payer: Self-pay | Admitting: Orthopedic Surgery

## 2017-10-07 ENCOUNTER — Observation Stay (HOSPITAL_COMMUNITY)
Admission: AD | Admit: 2017-10-07 | Discharge: 2017-10-08 | Disposition: A | Discharge status: Home / Self Care | Payer: 59 | Source: Ambulatory Visit | Attending: Orthopedic Surgery | Admitting: Orthopedic Surgery

## 2017-10-07 ENCOUNTER — Ambulatory Visit (HOSPITAL_COMMUNITY): Payer: 59 | Admitting: Certified Registered"

## 2017-10-07 ENCOUNTER — Encounter (HOSPITAL_COMMUNITY)
Admission: AD | Disposition: A | Discharge status: Home / Self Care | Payer: Self-pay | Source: Ambulatory Visit | Attending: Orthopedic Surgery

## 2017-10-07 ENCOUNTER — Inpatient Hospital Stay (HOSPITAL_COMMUNITY): Payer: 59

## 2017-10-07 ENCOUNTER — Encounter (HOSPITAL_COMMUNITY): Payer: Self-pay | Admitting: Certified Registered"

## 2017-10-07 DIAGNOSIS — I10 Essential (primary) hypertension: Secondary | ICD-10-CM | POA: Insufficient documentation

## 2017-10-07 DIAGNOSIS — E7800 Pure hypercholesterolemia, unspecified: Secondary | ICD-10-CM | POA: Insufficient documentation

## 2017-10-07 DIAGNOSIS — Z96659 Presence of unspecified artificial knee joint: Secondary | ICD-10-CM

## 2017-10-07 DIAGNOSIS — Z96652 Presence of left artificial knee joint: Secondary | ICD-10-CM

## 2017-10-07 DIAGNOSIS — M1712 Unilateral primary osteoarthritis, left knee: Principal | ICD-10-CM | POA: Insufficient documentation

## 2017-10-07 DIAGNOSIS — Z87891 Personal history of nicotine dependence: Secondary | ICD-10-CM | POA: Insufficient documentation

## 2017-10-07 DIAGNOSIS — E039 Hypothyroidism, unspecified: Secondary | ICD-10-CM | POA: Diagnosis not present

## 2017-10-07 DIAGNOSIS — Z79899 Other long term (current) drug therapy: Secondary | ICD-10-CM | POA: Diagnosis not present

## 2017-10-07 HISTORY — DX: Unilateral primary osteoarthritis, left knee: M17.12

## 2017-10-07 HISTORY — PX: TOTAL KNEE ARTHROPLASTY: SHX125

## 2017-10-07 SURGERY — ARTHROPLASTY, KNEE, TOTAL
Anesthesia: Spinal | Laterality: Left

## 2017-10-07 MED ORDER — RIVAROXABAN 10 MG PO TABS: 10.0000 mg | ORAL_TABLET | Freq: Every day | ORAL | 0 refills | Status: DC

## 2017-10-07 MED ORDER — DOCUSATE SODIUM 100 MG PO CAPS
100.0000 mg | ORAL_CAPSULE | Freq: Two times a day (BID) | ORAL | Status: DC
  Administered 2017-10-07 – 2017-10-08 (×2): 100 mg via ORAL
  Filled 2017-10-07 (×2): qty 1

## 2017-10-07 MED ORDER — CEFAZOLIN SODIUM-DEXTROSE 2-4 GM/100ML-% IV SOLN
2.0000 g | Freq: Four times a day (QID) | INTRAVENOUS | Status: AC
  Administered 2017-10-07 – 2017-10-08 (×2): 2 g via INTRAVENOUS
  Filled 2017-10-07 (×2): qty 100

## 2017-10-07 MED ORDER — SODIUM CHLORIDE 0.9 % IR SOLN
Status: DC | PRN
  Administered 2017-10-07: 1000 mL

## 2017-10-07 MED ORDER — ONDANSETRON HCL 4 MG/2ML IJ SOLN: 4.0000 mg | Freq: Four times a day (QID) | INTRAMUSCULAR | Status: DC | PRN

## 2017-10-07 MED ORDER — DEXAMETHASONE SODIUM PHOSPHATE 10 MG/ML IJ SOLN
INTRAMUSCULAR | Status: DC | PRN
  Administered 2017-10-07: 10 mg via INTRAVENOUS

## 2017-10-07 MED ORDER — PHENYLEPHRINE HCL 10 MG/ML IJ SOLN
INTRAVENOUS | Status: DC | PRN
  Administered 2017-10-07: 30 ug/min via INTRAVENOUS

## 2017-10-07 MED ORDER — PROPOFOL 10 MG/ML IV BOLUS
INTRAVENOUS | Status: AC
  Filled 2017-10-07: qty 20

## 2017-10-07 MED ORDER — OXYCODONE-ACETAMINOPHEN 10-325 MG PO TABS: 1.0000 | ORAL_TABLET | Freq: Four times a day (QID) | ORAL | 0 refills | Status: DC | PRN

## 2017-10-07 MED ORDER — METHOCARBAMOL 1000 MG/10ML IJ SOLN: 500.0000 mg | Freq: Four times a day (QID) | INTRAMUSCULAR | Status: DC | PRN

## 2017-10-07 MED ORDER — MIDAZOLAM HCL 2 MG/2ML IJ SOLN
INTRAMUSCULAR | Status: DC | PRN
  Administered 2017-10-07 (×2): 1 mg via INTRAVENOUS

## 2017-10-07 MED ORDER — CHLORHEXIDINE GLUCONATE 4 % EX LIQD: 60.0000 mL | Freq: Once | CUTANEOUS | Status: DC

## 2017-10-07 MED ORDER — AMLODIPINE BESYLATE 10 MG PO TABS
10.0000 mg | ORAL_TABLET | Freq: Every day | ORAL | Status: DC
  Administered 2017-10-08: 10 mg via ORAL
  Filled 2017-10-07: qty 1

## 2017-10-07 MED ORDER — BACLOFEN 10 MG PO TABS: 10.0000 mg | ORAL_TABLET | Freq: Three times a day (TID) | ORAL | 0 refills | Status: DC

## 2017-10-07 MED ORDER — HYDROMORPHONE HCL 1 MG/ML IJ SOLN: 0.5000 mg | INTRAMUSCULAR | Status: DC | PRN

## 2017-10-07 MED ORDER — ONDANSETRON HCL 4 MG PO TABS: 4.0000 mg | ORAL_TABLET | Freq: Three times a day (TID) | ORAL | 0 refills | Status: DC | PRN

## 2017-10-07 MED ORDER — METHOCARBAMOL 500 MG PO TABS
500.0000 mg | ORAL_TABLET | Freq: Four times a day (QID) | ORAL | Status: DC | PRN
  Administered 2017-10-07 – 2017-10-08 (×3): 500 mg via ORAL
  Filled 2017-10-07 (×3): qty 1

## 2017-10-07 MED ORDER — OXYCODONE HCL 5 MG PO TABS: 5.0000 mg | ORAL_TABLET | ORAL | Status: DC | PRN

## 2017-10-07 MED ORDER — KETOROLAC TROMETHAMINE 15 MG/ML IJ SOLN
7.5000 mg | Freq: Four times a day (QID) | INTRAMUSCULAR | Status: DC
  Administered 2017-10-07 – 2017-10-08 (×3): 7.5 mg via INTRAVENOUS
  Filled 2017-10-07 (×4): qty 1

## 2017-10-07 MED ORDER — ACETAMINOPHEN 650 MG RE SUPP: 650.0000 mg | RECTAL | Status: DC | PRN

## 2017-10-07 MED ORDER — BUPIVACAINE HCL (PF) 0.25 % IJ SOLN
INTRAMUSCULAR | Status: AC
  Filled 2017-10-07: qty 30

## 2017-10-07 MED ORDER — FENTANYL CITRATE (PF) 100 MCG/2ML IJ SOLN
INTRAMUSCULAR | Status: AC
  Administered 2017-10-07: 50 ug via INTRAVENOUS
  Filled 2017-10-07: qty 2

## 2017-10-07 MED ORDER — MIDAZOLAM HCL 2 MG/2ML IJ SOLN
INTRAMUSCULAR | Status: AC
  Filled 2017-10-07: qty 2

## 2017-10-07 MED ORDER — RIVAROXABAN 10 MG PO TABS
10.0000 mg | ORAL_TABLET | Freq: Every day | ORAL | Status: DC
  Administered 2017-10-08: 10 mg via ORAL
  Filled 2017-10-07: qty 1

## 2017-10-07 MED ORDER — SENNA 8.6 MG PO TABS
1.0000 | ORAL_TABLET | Freq: Two times a day (BID) | ORAL | Status: DC
  Administered 2017-10-07: 8.6 mg via ORAL
  Filled 2017-10-07: qty 1

## 2017-10-07 MED ORDER — GLYCOPYRROLATE 0.2 MG/ML IV SOSY
PREFILLED_SYRINGE | INTRAVENOUS | Status: DC | PRN
  Administered 2017-10-07: .2 mg via INTRAVENOUS

## 2017-10-07 MED ORDER — HYDROMORPHONE HCL 1 MG/ML IJ SOLN: 0.2500 mg | INTRAMUSCULAR | Status: DC | PRN

## 2017-10-07 MED ORDER — ONDANSETRON HCL 4 MG PO TABS: 4.0000 mg | ORAL_TABLET | Freq: Four times a day (QID) | ORAL | Status: DC | PRN

## 2017-10-07 MED ORDER — MIDAZOLAM HCL 2 MG/2ML IJ SOLN
INTRAMUSCULAR | Status: AC
  Administered 2017-10-07: 2 mg via INTRAVENOUS
  Filled 2017-10-07: qty 2

## 2017-10-07 MED ORDER — MAGNESIUM CITRATE PO SOLN: 1.0000 | Freq: Once | ORAL | Status: DC | PRN

## 2017-10-07 MED ORDER — MEPERIDINE HCL 25 MG/ML IJ SOLN: 6.2500 mg | INTRAMUSCULAR | Status: DC | PRN

## 2017-10-07 MED ORDER — SODIUM CHLORIDE 0.9 % IV SOLN: Freq: Once | INTRAVENOUS | Status: DC

## 2017-10-07 MED ORDER — ACETAMINOPHEN 325 MG PO TABS: 650.0000 mg | ORAL_TABLET | ORAL | Status: DC | PRN

## 2017-10-07 MED ORDER — METHOCARBAMOL 500 MG PO TABS
ORAL_TABLET | ORAL | Status: AC
  Filled 2017-10-07: qty 1

## 2017-10-07 MED ORDER — PHENOL 1.4 % MT LIQD: 1.0000 | OROMUCOSAL | Status: DC | PRN

## 2017-10-07 MED ORDER — OXYCODONE HCL 5 MG PO TABS
10.0000 mg | ORAL_TABLET | ORAL | Status: DC | PRN
  Administered 2017-10-07 – 2017-10-08 (×7): 10 mg via ORAL
  Filled 2017-10-07 (×6): qty 2

## 2017-10-07 MED ORDER — CEFAZOLIN SODIUM-DEXTROSE 2-4 GM/100ML-% IV SOLN: 2.0000 g | INTRAVENOUS | Status: DC

## 2017-10-07 MED ORDER — BUPIVACAINE HCL (PF) 0.25 % IJ SOLN
INTRAMUSCULAR | Status: DC | PRN
  Administered 2017-10-07: 30 mL

## 2017-10-07 MED ORDER — MIDAZOLAM HCL 2 MG/2ML IJ SOLN
2.0000 mg | Freq: Once | INTRAMUSCULAR | Status: AC
  Administered 2017-10-07: 2 mg via INTRAVENOUS

## 2017-10-07 MED ORDER — LACTATED RINGERS IV SOLN: INTRAVENOUS | Status: DC

## 2017-10-07 MED ORDER — ZOLPIDEM TARTRATE 5 MG PO TABS: 5.0000 mg | ORAL_TABLET | Freq: Every evening | ORAL | Status: DC | PRN

## 2017-10-07 MED ORDER — CEFAZOLIN SODIUM-DEXTROSE 2-4 GM/100ML-% IV SOLN
INTRAVENOUS | Status: AC
  Filled 2017-10-07: qty 100

## 2017-10-07 MED ORDER — OXYCODONE HCL 5 MG PO TABS
ORAL_TABLET | ORAL | Status: AC
  Filled 2017-10-07: qty 2

## 2017-10-07 MED ORDER — KETOROLAC TROMETHAMINE 30 MG/ML IJ SOLN
INTRAMUSCULAR | Status: DC | PRN
  Administered 2017-10-07: 30 mg

## 2017-10-07 MED ORDER — LEVOTHYROXINE SODIUM 25 MCG PO TABS
25.0000 ug | ORAL_TABLET | Freq: Every day | ORAL | Status: DC
  Administered 2017-10-08: 25 ug via ORAL
  Filled 2017-10-07: qty 1

## 2017-10-07 MED ORDER — ACETAMINOPHEN 500 MG PO TABS
1000.0000 mg | ORAL_TABLET | Freq: Four times a day (QID) | ORAL | Status: DC
  Administered 2017-10-07 – 2017-10-08 (×3): 1000 mg via ORAL
  Filled 2017-10-07 (×4): qty 2

## 2017-10-07 MED ORDER — FENTANYL CITRATE (PF) 100 MCG/2ML IJ SOLN
INTRAMUSCULAR | Status: DC | PRN
  Administered 2017-10-07: 50 ug via INTRAVENOUS

## 2017-10-07 MED ORDER — POTASSIUM CHLORIDE IN NACL 20-0.45 MEQ/L-% IV SOLN
INTRAVENOUS | Status: DC
  Administered 2017-10-07: 19:00:00 via INTRAVENOUS
  Filled 2017-10-07 (×2): qty 1000

## 2017-10-07 MED ORDER — 0.9 % SODIUM CHLORIDE (POUR BTL) OPTIME
TOPICAL | Status: DC | PRN
  Administered 2017-10-07: 1000 mL

## 2017-10-07 MED ORDER — BUPIVACAINE IN DEXTROSE 0.75-8.25 % IT SOLN
INTRATHECAL | Status: DC | PRN
  Administered 2017-10-07: 2 mL via INTRATHECAL

## 2017-10-07 MED ORDER — ROPIVACAINE HCL 5 MG/ML IJ SOLN
INTRAMUSCULAR | Status: DC | PRN
  Administered 2017-10-07: 30 mL via PERINEURAL

## 2017-10-07 MED ORDER — CEFAZOLIN SODIUM-DEXTROSE 2-4 GM/100ML-% IV SOLN
2.0000 g | INTRAVENOUS | Status: AC
  Administered 2017-10-07: 2 g via INTRAVENOUS

## 2017-10-07 MED ORDER — FENTANYL CITRATE (PF) 100 MCG/2ML IJ SOLN
50.0000 ug | Freq: Once | INTRAMUSCULAR | Status: AC
  Administered 2017-10-07: 50 ug via INTRAVENOUS

## 2017-10-07 MED ORDER — DIPHENHYDRAMINE HCL 12.5 MG/5ML PO ELIX: 12.5000 mg | ORAL_SOLUTION | ORAL | Status: DC | PRN

## 2017-10-07 MED ORDER — PROMETHAZINE HCL 25 MG/ML IJ SOLN: 6.2500 mg | INTRAMUSCULAR | Status: DC | PRN

## 2017-10-07 MED ORDER — ALUM & MAG HYDROXIDE-SIMETH 200-200-20 MG/5ML PO SUSP: 30.0000 mL | ORAL | Status: DC | PRN

## 2017-10-07 MED ORDER — PROPOFOL 10 MG/ML IV BOLUS
INTRAVENOUS | Status: DC | PRN
  Administered 2017-10-07: 20 mg via INTRAVENOUS

## 2017-10-07 MED ORDER — SENNA-DOCUSATE SODIUM 8.6-50 MG PO TABS: 2.0000 | ORAL_TABLET | Freq: Every day | ORAL | 1 refills | Status: DC

## 2017-10-07 MED ORDER — BISACODYL 10 MG RE SUPP: 10.0000 mg | Freq: Every day | RECTAL | Status: DC | PRN

## 2017-10-07 MED ORDER — LACTATED RINGERS IV SOLN
INTRAVENOUS | Status: DC
  Administered 2017-10-07 (×2): via INTRAVENOUS

## 2017-10-07 MED ORDER — PROPOFOL 500 MG/50ML IV EMUL
INTRAVENOUS | Status: DC | PRN
  Administered 2017-10-07: 25 ug/kg/min via INTRAVENOUS

## 2017-10-07 MED ORDER — POLYETHYLENE GLYCOL 3350 17 G PO PACK: 17.0000 g | PACK | Freq: Every day | ORAL | Status: DC | PRN

## 2017-10-07 MED ORDER — FENTANYL CITRATE (PF) 250 MCG/5ML IJ SOLN
INTRAMUSCULAR | Status: AC
  Filled 2017-10-07: qty 5

## 2017-10-07 MED ORDER — MENTHOL 3 MG MT LOZG: 1.0000 | LOZENGE | OROMUCOSAL | Status: DC | PRN

## 2017-10-07 MED ORDER — METOCLOPRAMIDE HCL 5 MG/ML IJ SOLN
5.0000 mg | Freq: Three times a day (TID) | INTRAMUSCULAR | Status: DC | PRN
  Administered 2017-10-08: 10 mg via INTRAVENOUS
  Filled 2017-10-07: qty 2

## 2017-10-07 MED ORDER — DEXAMETHASONE SODIUM PHOSPHATE 10 MG/ML IJ SOLN
10.0000 mg | Freq: Once | INTRAMUSCULAR | Status: AC
  Administered 2017-10-08: 10 mg via INTRAVENOUS
  Filled 2017-10-07: qty 1

## 2017-10-07 MED ORDER — KETOROLAC TROMETHAMINE 30 MG/ML IJ SOLN
INTRAMUSCULAR | Status: AC
  Filled 2017-10-07: qty 1

## 2017-10-07 MED ORDER — ONDANSETRON HCL 4 MG/2ML IJ SOLN
INTRAMUSCULAR | Status: DC | PRN
  Administered 2017-10-07: 4 mg via INTRAVENOUS

## 2017-10-07 MED ORDER — METOCLOPRAMIDE HCL 5 MG PO TABS: 5.0000 mg | ORAL_TABLET | Freq: Three times a day (TID) | ORAL | Status: DC | PRN

## 2017-10-07 SURGICAL SUPPLY — 67 items
BANDAGE ELASTIC 6 VELCRO ST LF (GAUZE/BANDAGES/DRESSINGS) ×1 IMPLANT
BANDAGE ESMARK 6X9 LF (GAUZE/BANDAGES/DRESSINGS) ×1 IMPLANT
BAR LOCKING TIBIAL (Orthopedic Implant) IMPLANT
BAR ORTH LCK STRL TIB TI VNGRD (Orthopedic Implant) ×1 IMPLANT
BIT DRILL QUICK REL 1/8 2PK SL (DRILL) IMPLANT
BLADE SAG 18X100X1.27 (BLADE) ×2 IMPLANT
BLADE SAW SGTL 13X75X1.27 (BLADE) ×2 IMPLANT
BNDG CMPR 9X6 STRL LF SNTH (GAUZE/BANDAGES/DRESSINGS) ×1
BNDG CMPR MED 10X6 ELC LF (GAUZE/BANDAGES/DRESSINGS) ×1
BNDG ELASTIC 6X10 VLCR STRL LF (GAUZE/BANDAGES/DRESSINGS) ×3 IMPLANT
BNDG ESMARK 6X9 LF (GAUZE/BANDAGES/DRESSINGS) ×2
BOWL SMART MIX CTS (DISPOSABLE) ×2 IMPLANT
CAP KNEE TOTAL 3 SIGMA ×1 IMPLANT
CEMENT BONE R 1X40 (Cement) ×2 IMPLANT
CLSR STERI-STRIP ANTIMIC 1/2X4 (GAUZE/BANDAGES/DRESSINGS) ×2 IMPLANT
CONT SPEC 4OZ CLIKSEAL STRL BL (MISCELLANEOUS) ×2 IMPLANT
COVER SURGICAL LIGHT HANDLE (MISCELLANEOUS) ×2 IMPLANT
CUFF TOURNIQUET SINGLE 34IN LL (TOURNIQUET CUFF) ×2 IMPLANT
DRAPE HALF SHEET 40X57 (DRAPES) ×2 IMPLANT
DRAPE U-SHAPE 47X51 STRL (DRAPES) ×2 IMPLANT
DRILL QUICK RELEASE 1/8 ×2 IMPLANT
DRILL QUICK RELEASE 1/8 INCH (DRILL)
DRSG MEPILEX BORDER 4X8 (GAUZE/BANDAGES/DRESSINGS) ×1 IMPLANT
DURAPREP 26ML APPLICATOR (WOUND CARE) ×2 IMPLANT
ELECT CAUTERY BLADE 6.4 (BLADE) ×2 IMPLANT
ELECT REM PT RETURN 9FT ADLT (ELECTROSURGICAL) ×2
ELECTRODE REM PT RTRN 9FT ADLT (ELECTROSURGICAL) ×1 IMPLANT
GAUZE SPONGE 4X4 12PLY STRL (GAUZE/BANDAGES/DRESSINGS) ×1 IMPLANT
GLOVE BIOGEL PI ORTHO PRO SZ8 (GLOVE) ×2
GLOVE ORTHO TXT STRL SZ7.5 (GLOVE) ×2 IMPLANT
GLOVE PI ORTHO PRO STRL SZ8 (GLOVE) ×2 IMPLANT
GLOVE SURG ORTHO 8.0 STRL STRW (GLOVE) ×2 IMPLANT
GOWN STRL REUS W/ TWL XL LVL3 (GOWN DISPOSABLE) ×1 IMPLANT
GOWN STRL REUS W/TWL 2XL LVL3 (GOWN DISPOSABLE) ×2 IMPLANT
GOWN STRL REUS W/TWL XL LVL3 (GOWN DISPOSABLE) ×2
HANDPIECE INTERPULSE COAX TIP (DISPOSABLE) ×2
HOOD PEEL AWAY FACE SHEILD DIS (HOOD) ×2 IMPLANT
HOOD PEEL AWAY FLYTE STAYCOOL (MISCELLANEOUS) ×2 IMPLANT
IMMOBILIZER KNEE 22 (SOFTGOODS) ×2 IMPLANT
IV NS 1000ML (IV SOLUTION) ×2
IV NS 1000ML BAXH (IV SOLUTION) IMPLANT
KIT BASIN OR (CUSTOM PROCEDURE TRAY) ×2 IMPLANT
KIT ROOM TURNOVER OR (KITS) ×2 IMPLANT
MANIFOLD NEPTUNE II (INSTRUMENTS) ×2 IMPLANT
NDL 18GX1X1/2 (RX/OR ONLY) (NEEDLE) ×1 IMPLANT
NEEDLE 18GX1X1/2 (RX/OR ONLY) (NEEDLE) ×2 IMPLANT
NS IRRIG 1000ML POUR BTL (IV SOLUTION) ×2 IMPLANT
PACK TOTAL JOINT (CUSTOM PROCEDURE TRAY) ×2 IMPLANT
PAD ABD 8X10 STRL (GAUZE/BANDAGES/DRESSINGS) ×1 IMPLANT
PAD ARMBOARD 7.5X6 YLW CONV (MISCELLANEOUS) ×3 IMPLANT
PAD CAST 4YDX4 CTTN HI CHSV (CAST SUPPLIES) ×1 IMPLANT
PADDING CAST COTTON 4X4 STRL (CAST SUPPLIES)
PADDING CAST COTTON 6X4 STRL (CAST SUPPLIES) ×1 IMPLANT
SET HNDPC FAN SPRY TIP SCT (DISPOSABLE) ×1 IMPLANT
SUCTION FRAZIER HANDLE 10FR (MISCELLANEOUS) ×1
SUCTION TUBE FRAZIER 10FR DISP (MISCELLANEOUS) ×1 IMPLANT
SUT VIC AB 0 CT1 27 (SUTURE) ×2
SUT VIC AB 0 CT1 27XBRD ANBCTR (SUTURE) ×1 IMPLANT
SUT VIC AB 2-0 CT1 27 (SUTURE) ×2
SUT VIC AB 2-0 CT1 TAPERPNT 27 (SUTURE) ×1 IMPLANT
SUT VIC AB 3-0 SH 8-18 (SUTURE) ×3 IMPLANT
SYR 10ML LL (SYRINGE) ×1 IMPLANT
SYR 30ML LL (SYRINGE) ×1 IMPLANT
TIBIAL LOCKING BAR (Orthopedic Implant) ×2 IMPLANT
TOWEL OR 17X24 6PK STRL BLUE (TOWEL DISPOSABLE) ×1 IMPLANT
TOWEL OR 17X26 10 PK STRL BLUE (TOWEL DISPOSABLE) ×2 IMPLANT
TRAY CATH 16FR W/PLASTIC CATH (SET/KITS/TRAYS/PACK) IMPLANT

## 2017-10-07 NOTE — Anesthesia Preprocedure Evaluation (Addendum)
Anesthesia Evaluation  Patient identified by MRN, date of birth, ID band Patient awake    Reviewed: Allergy & Precautions, NPO status , Patient's Chart, lab work & pertinent test results  Airway Mallampati: I  TM Distance: >3 FB Neck ROM: Full    Dental  (+) Teeth Intact, Dental Advisory Given, Caps   Pulmonary former smoker,    breath sounds clear to auscultation- rhonchi       Cardiovascular hypertension, Pt. on medications  Rhythm:Regular Rate:Normal     Neuro/Psych    GI/Hepatic negative GI ROS, Neg liver ROS,   Endo/Other  Hypothyroidism   Renal/GU negative Renal ROS     Musculoskeletal  (+) Arthritis ,   Abdominal Normal abdominal exam  (+)   Peds  Hematology negative hematology ROS (+)   Anesthesia Other Findings   Reproductive/Obstetrics                            Anesthesia Physical Anesthesia Plan  ASA: II  Anesthesia Plan: Spinal   Post-op Pain Management:  Regional for Post-op pain   Induction: Intravenous  PONV Risk Score and Plan: 3 and Propofol infusion, Dexamethasone and Ondansetron  Airway Management Planned: Natural Airway and Nasal Cannula  Additional Equipment: None  Intra-op Plan:   Post-operative Plan:   Informed Consent: I have reviewed the patients History and Physical, chart, labs and discussed the procedure including the risks, benefits and alternatives for the proposed anesthesia with the patient or authorized representative who has indicated his/her understanding and acceptance.   Dental advisory given  Plan Discussed with: CRNA  Anesthesia Plan Comments:         Anesthesia Quick Evaluation

## 2017-10-07 NOTE — Anesthesia Procedure Notes (Addendum)
Anesthesia Regional Block: Adductor canal block   Pre-Anesthetic Checklist: ,, timeout performed, Correct Patient, Correct Site, Correct Laterality, Correct Procedure, Correct Position, site marked, Risks and benefits discussed,  Surgical consent,  Pre-op evaluation,  At surgeon's request and post-op pain management  Laterality: Left  Prep: chloraprep       Needles:  Injection technique: Single-shot  Needle Type: Echogenic Needle     Needle Length: 9cm  Needle Gauge: 21     Additional Needles:   Procedures:,,,, ultrasound used (permanent image in chart),,,,  Narrative:  Start time: 10/07/2017 12:35 PM End time: 10/07/2017 12:45 PM Injection made incrementally with aspirations every 5 mL.  Performed by: Personally  Anesthesiologist: Effie Berkshire, MD  Additional Notes: Patient tolerated the procedure well. Local anesthetic introduced in an incremental fashion under minimal resistance after negative aspirations. No paresthesias were elicited. After completion of the procedure, no acute issues were identified and patient continued to be monitored by RN.

## 2017-10-07 NOTE — Transfer of Care (Signed)
Immediate Anesthesia Transfer of Care Note  Patient: Abigail Flores  Procedure(s) Performed: TOTAL KNEE ARTHROPLASTY (Left )  Patient Location: PACU  Anesthesia Type:General  Level of Consciousness: oriented, drowsy and patient cooperative  Airway & Oxygen Therapy: Patient Spontanous Breathing and Patient connected to face mask oxygen  Post-op Assessment: Report given to RN and Post -op Vital signs reviewed and stable  Post vital signs: Reviewed  Last Vitals:  Vitals:   10/07/17 1250 10/07/17 1607  BP: (!) 156/85 107/76  Pulse: 99 99  Resp: 13 18  Temp:  (!) 36.3 C  SpO2: 96% 96%    Last Pain:  Vitals:   10/07/17 1607  TempSrc:   PainSc: Asleep         Complications: No apparent anesthesia complications

## 2017-10-07 NOTE — Anesthesia Postprocedure Evaluation (Signed)
Anesthesia Post Note  Patient: Abigail Flores  Procedure(s) Performed: TOTAL KNEE ARTHROPLASTY (Left )     Patient location during evaluation: PACU Anesthesia Type: Spinal and MAC Level of consciousness: awake and alert Pain management: pain level controlled Vital Signs Assessment: post-procedure vital signs reviewed and stable Respiratory status: spontaneous breathing and respiratory function stable Cardiovascular status: blood pressure returned to baseline and stable Postop Assessment: spinal receding Anesthetic complications: no    Last Vitals:  Vitals:   10/07/17 1644 10/07/17 1652  BP: 138/83 135/90  Pulse: 94 96  Resp: 16 11  Temp:    SpO2: 98% 99%    Last Pain:  Vitals:   10/07/17 1700  TempSrc:   PainSc: 0-No pain                 [name] DANIEL
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
## 2017-10-07 NOTE — Op Note (Signed)
DATE OF SURGERY:  10/07/2017 TIME: 3:24 PM  PATIENT NAME:  Abigail Flores   AGE: 57 y.o.    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   OPERATIVE IMPLANTS: Biomet Vanguard Fixed Bearing Posterior Stabilized Femur size 65, Tibia size 71, Patella size 34 3-peg oval button, with a 10 mm polyethylene insert.   PREOPERATIVE INDICATIONS:  Abigail Flores is a 57 y.o. year old female with end stage bone on bone degenerative arthritis of the knee who failed conservative treatment, including injections, antiinflammatories, activity modification, and assistive devices, and had significant impairment of their activities of daily living, and elected for Total Knee Arthroplasty.   The risks, benefits, and alternatives were discussed at length including but not limited to the risks of infection, bleeding, nerve injury, stiffness, blood clots, the need for revision surgery, cardiopulmonary complications, among others, and they were willing to proceed.  OPERATIVE FINDINGS AND UNIQUE ASPECTS OF THE CASE: There was substantial synovial hypertrophy with abnormal coloration, the synovium was all very dark purple, and friable.  This was diffuse throughout all compartments of the knee.  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  Lateral compartment had end-stage grade 4 changes on the lateral condyle with pigmentation of wall bone.  Her bone quality was fairly mediocre.  Lateral meniscus was no longer present, the medial meniscus was also very diminutive.   I did cut the femur twice, and cut the femur posteriorly externally rotated at 4 degrees.  I used a 5 degree valgus cutting block for the distal femoral cut.  ESTIMATED BLOOD LOSS: 200 mL  OPERATIVE DESCRIPTION:  The patient was brought to the operative room and placed in a supine position.  Spinal anesthesia was administered.  IV antibiotics were given.  The lower extremity was prepped and draped in the usual sterile fashion.  Time out was performed.  The leg was elevated and exsanguinated and the tourniquet was inflated.  Anterior quadriceps tendon splitting approach was performed.  The patella was everted and osteophytes were removed.  The anterior horn of the medial and lateral meniscus was removed.  The synovium was very abnormal, I did send a sample of the synovial fluid for analysis, Gram stain culture and sensitivity, and I also sent a specimen of the synovial fluid to pathology to evaluate for pigmented villonodular synovitis.  The distal femur was opened with the drill and the intramedullary distal femoral cutting jig was utilized, set at 5 degrees resecting 10 mm off the distal femur.  Care was taken to protect the collateral ligaments.  The lateral femoral condyle was slightly hypoplastic.  Then the extramedullary tibial cutting jig was utilized making the appropriate cut using the anterior tibial crest as a reference building in appropriate posterior slope.  Care was taken during the cut to protect the medial and collateral ligaments.  The proximal tibia was removed along with the posterior horns of the menisci.  The PCL was sacrificed.    The extensor gap was measured and was only 8 mm, I went back and took an additional 2 mm off of the femur, and then it measured approximately 23mm.  The distal femoral sizing jig was applied, taking care to avoid notching.  Then the 4-in-1 cutting jig was applied and the anterior and posterior femur was cut, along with the chamfer cuts.  All  posterior osteophytes were removed.  The flexion gap was then measured and was symmetric with the extension gap.  I completed the distal femoral preparation using the appropriate jig to prepare the box.  The patella was then measured, and cut with the saw.  The thickness before the cut was 21 and after the cut was 14.  The proximal tibia sized and prepared accordingly with the reamer and the punch, and then all components were trialed with the 80mm poly insert.  The knee was found to have excellent balance and full motion.    The above named components were then cemented into place and all excess cement was removed.  The real polyethylene implant was placed.  After the cement had cured I released the tourniquet and confirmed excellent hemostasis with no major posterior vessel injury.    The knee was easily taken through a range of motion and the patella tracked well and the knee irrigated copiously and the parapatellar and subcutaneous tissue closed with vicryl, and monocryl with steri strips for the skin.  The wounds were injected with marcaine, and dressed with sterile gauze and the patient was awakened and returned to the PACU in stable and satisfactory condition.  There were no complications.  Total tourniquet time was 72 minutes.

## 2017-10-07 NOTE — Anesthesia Procedure Notes (Signed)
Spinal  Patient location during procedure: OR Start time: 10/07/2017 1:37 PM End time: 10/07/2017 1:39 PM Staffing Anesthesiologist: Effie Berkshire, MD Performed: anesthesiologist  Preanesthetic Checklist Completed: patient identified, site marked, surgical consent, pre-op evaluation, timeout performed, IV checked, risks and benefits discussed and monitors and equipment checked Spinal Block Patient position: sitting Prep: Betadine Patient monitoring: heart rate, cardiac monitor, continuous pulse ox and blood pressure Approach: midline Location: L3-4 Injection technique: single-shot Needle Needle type: Pencan  Needle gauge: 22 G Needle length: 9 cm Catheter type: closed end Catheter size: 20 g Additional Notes Negative paresthesia. Negative blood return. Positive free-flowing CSF. Expiration date of kit checked and confirmed. Patient tolerated procedure well, without complications.

## 2017-10-07 NOTE — Anesthesia Procedure Notes (Signed)
Procedure Name: MAC Date/Time: 10/07/2017 2:02 PM Performed by: Imagene Riches, CRNA Pre-anesthesia Checklist: Patient identified, Emergency Drugs available, Suction available and Patient being monitored Patient Re-evaluated:Patient Re-evaluated prior to induction Oxygen Delivery Method: Simple face mask Preoxygenation: Pre-oxygenation with 100% oxygen

## 2017-10-08 ENCOUNTER — Encounter (HOSPITAL_COMMUNITY): Payer: Self-pay | Admitting: Orthopedic Surgery

## 2017-10-08 DIAGNOSIS — Z96652 Presence of left artificial knee joint: Secondary | ICD-10-CM

## 2017-10-08 DIAGNOSIS — M1712 Unilateral primary osteoarthritis, left knee: Secondary | ICD-10-CM | POA: Diagnosis not present

## 2017-10-08 LAB — CBC
HCT: 33.6 % — ABNORMAL LOW (ref 36.0–46.0)
Hemoglobin: 10.8 g/dL — ABNORMAL LOW (ref 12.0–15.0)
MCH: 26.3 pg (ref 26.0–34.0)
MCHC: 32.1 g/dL (ref 30.0–36.0)
MCV: 81.8 fL (ref 78.0–100.0)
Platelets: 431 10*3/uL — ABNORMAL HIGH (ref 150–400)
RBC: 4.11 MIL/uL (ref 3.87–5.11)
RDW: 13.5 % (ref 11.5–15.5)
WBC: 11.7 10*3/uL — AB (ref 4.0–10.5)

## 2017-10-08 LAB — BASIC METABOLIC PANEL
ANION GAP: 8 (ref 5–15)
BUN: 8 mg/dL (ref 6–20)
CALCIUM: 9.3 mg/dL (ref 8.9–10.3)
CHLORIDE: 99 mmol/L — AB (ref 101–111)
CO2: 26 mmol/L (ref 22–32)
Creatinine, Ser: 0.63 mg/dL (ref 0.44–1.00)
GFR calc Af Amer: >60 mL/min (ref >60)
GFR calc non Af Amer: >60 mL/min (ref >60)
GLUCOSE: 120 mg/dL — AB (ref 65–99)
POTASSIUM: 4.3 mmol/L (ref 3.5–5.1)
Sodium: 133 mmol/L — ABNORMAL LOW (ref 135–145)
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
## 2017-10-08 NOTE — Discharge Instructions (Signed)
INSTRUCTIONS AFTER JOINT REPLACEMENT  ° °o Remove items at home which could result in a fall. This includes throw rugs or furniture in walking pathways °o ICE to the affected joint every three hours while awake for 30 minutes at a time, for at least the first 3-5 days, and then as needed for pain and swelling.  Continue to use ice for pain and swelling. You may notice swelling that will progress down to the foot and ankle.  This is normal after surgery.  Elevate your leg when you are not up walking on it.   °o Continue to use the breathing machine you got in the hospital (incentive spirometer) which will help keep your temperature down.  It is common for your temperature to cycle up and down following surgery, especially at night when you are not up moving around and exerting yourself.  The breathing machine keeps your lungs expanded and your temperature down. ° ° °DIET:  As you were doing prior to hospitalization, we recommend a well-balanced diet. ° °DRESSING / WOUND CARE / SHOWERING ° °You may change your dressing 3-5 days after surgery.  Then change the dressing every day with sterile gauze.  Please use good hand washing techniques before changing the dressing.  Do not use any lotions or creams on the incision until instructed by your surgeon. ° °ACTIVITY ° °o Increase activity slowly as tolerated, but follow the weight bearing instructions below.   °o No driving for 6 weeks or until further direction given by your physician.  You cannot drive while taking narcotics.  °o No lifting or carrying greater than 10 lbs. until further directed by your surgeon. °o Avoid periods of inactivity such as sitting longer than an hour when not asleep. This helps prevent blood clots.  °o You may return to work once you are authorized by your doctor.  ° ° ° °WEIGHT BEARING  ° °Weight bearing as tolerated with assist device (walker, cane, etc) as directed, use it as long as suggested by your surgeon or therapist, typically at  least 4-6 weeks. ° ° °EXERCISES ° °Results after joint replacement surgery are often greatly improved when you follow the exercise, range of motion and muscle strengthening exercises prescribed by your doctor. Safety measures are also important to protect the joint from further injury. Any time any of these exercises cause you to have increased pain or swelling, decrease what you are doing until you are comfortable again and then slowly increase them. If you have problems or questions, call your caregiver or physical therapist for advice.  ° °Rehabilitation is important following a joint replacement. After just a few days of immobilization, the muscles of the leg can become weakened and shrink (atrophy).  These exercises are designed to build up the tone and strength of the thigh and leg muscles and to improve motion. Often times heat used for twenty to thirty minutes before working out will loosen up your tissues and help with improving the range of motion but do not use heat for the first two weeks following surgery (sometimes heat can increase post-operative swelling).  ° °These exercises can be done on a training (exercise) mat, on the floor, on a table or on a bed. Use whatever works the best and is most comfortable for you.    Use music or television while you are exercising so that the exercises are a pleasant break in your day. This will make your life better with the exercises acting as a break   in your routine that you can look forward to.   Perform all exercises about fifteen times, three times per day or as directed.  You should exercise both the operative leg and the other leg as well. ° °Exercises include: °  °• Quad Sets - Tighten up the muscle on the front of the thigh (Quad) and hold for 5-10 seconds.   °• Straight Leg Raises - With your knee straight (if you were given a brace, keep it on), lift the leg to 60 degrees, hold for 3 seconds, and slowly lower the leg.  Perform this exercise against  resistance later as your leg gets stronger.  °• Leg Slides: Lying on your back, slowly slide your foot toward your buttocks, bending your knee up off the floor (only go as far as is comfortable). Then slowly slide your foot back down until your leg is flat on the floor again.  °• Angel Wings: Lying on your back spread your legs to the side as far apart as you can without causing discomfort.  °• Hamstring Strength:  Lying on your back, push your heel against the floor with your leg straight by tightening up the muscles of your buttocks.  Repeat, but this time bend your knee to a comfortable angle, and push your heel against the floor.  You may put a pillow under the heel to make it more comfortable if necessary.  ° °A rehabilitation program following joint replacement surgery can speed recovery and prevent re-injury in the future due to weakened muscles. Contact your doctor or a physical therapist for more information on knee rehabilitation.  ° ° °CONSTIPATION ° °Constipation is defined medically as fewer than three stools per week and severe constipation as less than one stool per week.  Even if you have a regular bowel pattern at home, your normal regimen is likely to be disrupted due to multiple reasons following surgery.  Combination of anesthesia, postoperative narcotics, change in appetite and fluid intake all can affect your bowels.  ° °YOU MUST use at least one of the following options; they are listed in order of increasing strength to get the job done.  They are all available over the counter, and you may need to use some, POSSIBLY even all of these options:   ° °Drink plenty of fluids (prune juice may be helpful) and high fiber foods °Colace 100 mg by mouth twice a day  °Senokot for constipation as directed and as needed Dulcolax (bisacodyl), take with full glass of water  °Miralax (polyethylene glycol) once or twice a day as needed. ° °If you have tried all these things and are unable to have a bowel  movement in the first 3-4 days after surgery call either your surgeon or your primary doctor.   ° °If you experience loose stools or diarrhea, hold the medications until you stool forms back up.  If your symptoms do not get better within 1 week or if they get worse, check with your doctor.  If you experience "the worst abdominal pain ever" or develop nausea or vomiting, please contact the office immediately for further recommendations for treatment. ° ° °ITCHING:  If you experience itching with your medications, try taking only a single pain pill, or even half a pain pill at a time.  You can also use Benadryl over the counter for itching or also to help with sleep.  ° °TED HOSE STOCKINGS:  Use stockings on both legs until for at least 2 weeks or as   directed by physician office. They may be removed at night for sleeping. ° °MEDICATIONS:  See your medication summary on the “After Visit Summary” that nursing will review with you.  You may have some home medications which will be placed on hold until you complete the course of blood thinner medication.  It is important for you to complete the blood thinner medication as prescribed. ° °PRECAUTIONS:  If you experience chest pain or shortness of breath - call 911 immediately for transfer to the hospital emergency department.  ° °If you develop a fever greater that 101 F, purulent drainage from wound, increased redness or drainage from wound, foul odor from the wound/dressing, or calf pain - CONTACT YOUR SURGEON.   °                                                °FOLLOW-UP APPOINTMENTS:  If you do not already have a post-op appointment, please call the office for an appointment to be seen by your surgeon.  Guidelines for how soon to be seen are listed in your “After Visit Summary”, but are typically between 1-4 weeks after surgery. ° ° °MAKE SURE YOU:  °• Understand these instructions.  °• Get help right away if you are not doing well or get worse.  ° ° °Thank you for  letting us be a part of your medical care team.  It is a privilege we respect greatly.  We hope these instructions will help you stay on track for a fast and full recovery!  ° °Information on my medicine - XARELTO® (Rivaroxaban) ° °Why was Xarelto® prescribed for you? °Xarelto® was prescribed for you to reduce the risk of blood clots forming after orthopedic surgery. The medical term for these abnormal blood clots is venous thromboembolism (VTE). ° °What do you need to know about xarelto® ? °Take your Xarelto® ONCE DAILY at the same time every day. °You may take it either with or without food. ° °If you have difficulty swallowing the tablet whole, you may crush it and mix in applesauce just prior to taking your dose. ° °Take Xarelto® exactly as prescribed by your doctor and DO NOT stop taking Xarelto® without talking to the doctor who prescribed the medication.  Stopping without other VTE prevention medication to take the place of Xarelto® may increase your risk of developing a clot. ° °After discharge, you should have regular check-up appointments with your healthcare provider that is prescribing your Xarelto®.   ° °What do you do if you miss a dose? °If you miss a dose, take it as soon as you remember on the same day then continue your regularly scheduled once daily regimen the next day. Do not take two doses of Xarelto® on the same day.  ° °Important Safety Information °A possible side effect of Xarelto® is bleeding. You should call your healthcare provider right away if you experience any of the following: °? Bleeding from an injury or your nose that does not stop. °? Unusual colored urine (red or dark brown) or unusual colored stools (red or black). °? Unusual bruising for unknown reasons. °? A serious fall or if you hit your head (even if there is no bleeding). ° °Some medicines may interact with Xarelto® and might increase your risk of bleeding while on Xarelto®. To help avoid this, consult your healthcare  provider   or pharmacist prior to using any new prescription or non-prescription medications, including herbals, vitamins, non-steroidal anti-inflammatory drugs (NSAIDs) and supplements. ° °This website has more information on Xarelto®: www.xarelto.com. ° ° °

## 2017-10-08 NOTE — Care Management (Signed)
Case manager spoke with patient and her husband concerning dsicharge plan and DME. Patient has RW and 3in1. She says she will be going directly to outpatient therapy in [place].She will have family support at discharge.  No further needs identified.

## 2017-10-08 NOTE — Progress Notes (Signed)
Patient is doing well and wants to go home.  She is neurologically intact and feels well.  She will work with therapy today and possibly discharge home later today in which case she will be converted to observation, and will need appropriate conversion of her hospitalization status documented.  [name], MD
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
## 2017-10-12 LAB — AEROBIC/ANAEROBIC CULTURE W GRAM STAIN (SURGICAL/DEEP WOUND): Culture: NO GROWTH

## 2018-03-02 ENCOUNTER — Encounter: Payer: Self-pay | Admitting: Obstetrics and Gynecology

## 2018-08-17 ENCOUNTER — Ambulatory Visit: Payer: 59 | Admitting: Obstetrics and Gynecology

## 2018-08-17 ENCOUNTER — Other Ambulatory Visit: Payer: Self-pay

## 2018-08-17 ENCOUNTER — Encounter: Payer: Self-pay | Admitting: Obstetrics and Gynecology

## 2018-08-17 VITALS — BP 128/84 | HR 72 | Ht 63.5 in | Wt 182.8 lb

## 2018-08-17 DIAGNOSIS — Z01419 Encounter for gynecological examination (general) (routine) without abnormal findings: Secondary | ICD-10-CM

## 2018-08-17 DIAGNOSIS — Z1211 Encounter for screening for malignant neoplasm of colon: Secondary | ICD-10-CM | POA: Diagnosis not present

## 2018-08-17 DIAGNOSIS — Z23 Encounter for immunization: Secondary | ICD-10-CM
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
## 2018-08-17 NOTE — Addendum Note (Signed)
Addended by: Yates Decamp on: 08/17/2018 01:57 PM   Modules accepted: Orders

## 2018-08-17 NOTE — Patient Instructions (Signed)
EXERCISE AND DIET:  We recommended that you start or continue a regular exercise program for good health. Regular exercise means any activity that makes your heart beat faster and makes you sweat.  We recommend exercising at least 30 minutes per day at least 3 days a week, preferably 4 or 5.  We also recommend a diet low in fat and sugar.  Inactivity, poor dietary choices and obesity can cause diabetes, heart attack, stroke, and kidney damage, among others.    ALCOHOL AND SMOKING:  Women should limit their alcohol intake to no more than 7 drinks/beers/glasses of wine (combined, not each!) per week. Moderation of alcohol intake to this level decreases your risk of breast cancer and liver damage. And of course, no recreational drugs are part of a healthy lifestyle.  And absolutely no smoking or even second hand smoke. Most people know smoking can cause heart and lung diseases, but did you know it also contributes to weakening of your bones? Aging of your skin?  Yellowing of your teeth and nails?  CALCIUM AND VITAMIN D:  Adequate intake of calcium and Vitamin D are recommended.  The recommendations for exact amounts of these supplements seem to change often, but generally speaking 1,200 mg of calcium (between diet and supplement) and 800 units of Vitamin D per day seems prudent. Certain women may benefit from higher intake of Vitamin D.  If you are among these women, your doctor will have told you during your visit.    PAP SMEARS:  Pap smears, to check for cervical cancer or precancers,  have traditionally been done yearly, although recent scientific advances have shown that most women can have pap smears less often.  However, every woman still should have a physical exam from her gynecologist every year. It will include a breast check, inspection of the vulva and vagina to check for abnormal growths or skin changes, a visual exam of the cervix, and then an exam to evaluate the size and shape of the uterus and  ovaries.  And after 58 years of age, a rectal exam is indicated to check for rectal cancers. We will also provide age appropriate advice regarding health maintenance, like when you should have certain vaccines, screening for sexually transmitted diseases, bone density testing, colonoscopy, mammograms, etc.   MAMMOGRAMS:  All women over 40 years old should have a yearly mammogram. Many facilities now offer a "3D" mammogram, which may cost around $50 extra out of pocket. If possible,  we recommend you accept the option to have the 3D mammogram performed.  It both reduces the number of women who will be called back for extra views which then turn out to be normal, and it is better than the routine mammogram at detecting truly abnormal areas.    COLONOSCOPY:  Colonoscopy to screen for colon cancer is recommended for all women at age 58.  We know, you hate the idea of the prep.  We agree, BUT, having colon cancer and not knowing it is worse!!  Colon cancer so often starts as a polyp that can be seen and removed at colonscopy, which can quite literally save your life!  And if your first colonoscopy is normal and you have no family history of colon cancer, most women don't have to have it again for 10 years.  Once every ten years, you can do something that may end up saving your life, right?  We will be happy to help you get it scheduled when you are ready.  Be sure to check your insurance coverage so you understand how much it will cost.  It may be covered as a preventative service at no cost, but you should check your particular policy.      Breast Self-Awareness Breast self-awareness means being familiar with how your breasts look and feel. It involves checking your breasts regularly and reporting any changes to your health care provider. Practicing breast self-awareness is important. A change in your breasts can be a sign of a serious medical problem. Being familiar with how your breasts look and feel allows  you to find any problems early, when treatment is more likely to be successful. All women should practice breast self-awareness, including women who have had breast implants. How to do a breast self-exam One way to learn what is normal for your breasts and whether your breasts are changing is to do a breast self-exam. To do a breast self-exam: Look for Changes  1. Remove all the clothing above your waist. 2. Stand in front of a mirror in a room with good lighting. 3. Put your hands on your hips. 4. Push your hands firmly downward. 5. Compare your breasts in the mirror. Look for differences between them (asymmetry), such as: ? Differences in shape. ? Differences in size. ? Puckers, dips, and bumps in one breast and not the other. 6. Look at each breast for changes in your skin, such as: ? Redness. ? Scaly areas. 7. Look for changes in your nipples, such as: ? Discharge. ? Bleeding. ? Dimpling. ? Redness. ? A change in position. Feel for Changes  Carefully feel your breasts for lumps and changes. It is best to do this while lying on your back on the floor and again while sitting or standing in the shower or tub with soapy water on your skin. Feel each breast in the following way:  Place the arm on the side of the breast you are examining above your head.  Feel your breast with the other hand.  Start in the nipple area and make  inch (2 cm) overlapping circles to feel your breast. Use the pads of your three middle fingers to do this. Apply light pressure, then medium pressure, then firm pressure. The light pressure will allow you to feel the tissue closest to the skin. The medium pressure will allow you to feel the tissue that is a little deeper. The firm pressure will allow you to feel the tissue close to the ribs.  Continue the overlapping circles, moving downward over the breast until you feel your ribs below your breast.  Move one finger-width toward the center of the body.  Continue to use the  inch (2 cm) overlapping circles to feel your breast as you move slowly up toward your collarbone.  Continue the up and down exam using all three pressures until you reach your armpit.  Write Down What You Find  Write down what is normal for each breast and any changes that you find. Keep a written record with breast changes or normal findings for each breast. By writing this information down, you do not need to depend only on memory for size, tenderness, or location. Write down where you are in your menstrual cycle, if you are still menstruating. If you are having trouble noticing differences in your breasts, do not get discouraged. With time you will become more familiar with the variations in your breasts and more comfortable with the exam. How often should I examine my breasts? Examine   your breasts every month. If you are breastfeeding, the best time to examine your breasts is after a feeding or after using a breast pump. If you menstruate, the best time to examine your breasts is 5-7 days after your period is over. During your period, your breasts are lumpier, and it may be more difficult to notice changes. When should I see my health care provider? See your health care provider if you notice:  A change in shape or size of your breasts or nipples.  A change in the skin of your breast or nipples, such as a reddened or scaly area.  Unusual discharge from your nipples.  A lump or thick area that was not there before.  Pain in your breasts.  Anything that concerns you.  This information is not intended to replace advice given to you by your health care provider. Make sure you discuss any questions you have with your health care provider. Document Released: 09/16/2005 Document Revised: 02/22/2016 Document Reviewed: 08/06/2015 Elsevier Interactive Patient Education  2018 Elsevier Inc.  

## 2018-10-05 DIAGNOSIS — Z6830 Body mass index (BMI) 30.0-30.9, adult: Secondary | ICD-10-CM | POA: Diagnosis not present

## 2018-10-05 DIAGNOSIS — R251 Tremor, unspecified: Secondary | ICD-10-CM | POA: Diagnosis not present

## 2018-10-05 DIAGNOSIS — Z713 Dietary counseling and surveillance: Secondary | ICD-10-CM | POA: Diagnosis not present

## 2018-10-05 DIAGNOSIS — R05 Cough: Secondary | ICD-10-CM | POA: Diagnosis not present

## 2018-10-05 DIAGNOSIS — I10 Essential (primary) hypertension: Secondary | ICD-10-CM | POA: Diagnosis not present

## 2018-10-12 DIAGNOSIS — L299 Pruritus, unspecified: Secondary | ICD-10-CM | POA: Diagnosis not present

## 2018-10-12 DIAGNOSIS — J309 Allergic rhinitis, unspecified: Secondary | ICD-10-CM | POA: Diagnosis not present

## 2018-10-12 DIAGNOSIS — R21 Rash and other nonspecific skin eruption: Secondary | ICD-10-CM | POA: Diagnosis not present

## 2018-12-07 ENCOUNTER — Encounter: Payer: Self-pay | Admitting: Neurology

## 2018-12-07 ENCOUNTER — Ambulatory Visit: Payer: BLUE CROSS/BLUE SHIELD | Admitting: Neurology

## 2018-12-07 ENCOUNTER — Telehealth: Payer: Self-pay

## 2018-12-07 ENCOUNTER — Telehealth: Payer: Self-pay | Admitting: Neurology

## 2018-12-07 VITALS — BP 155/101 | HR 82 | Ht 65.0 in | Wt 184.0 lb

## 2018-12-07 DIAGNOSIS — G20 Parkinson's disease: Secondary | ICD-10-CM

## 2018-12-07 NOTE — Telephone Encounter (Signed)
Pt signed DaT scan consent. Please process for DaT scan.
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
## 2018-12-07 NOTE — Telephone Encounter (Signed)
Thanks Kristen 

## 2018-12-07 NOTE — Telephone Encounter (Signed)
Pt's husband Kasandra Knudsen is requesting referral to Dr Wells [facility] in [place] at [facility]) [phone] for 2nd opinion. Please call to advise when referral has been sent

## 2018-12-07 NOTE — Patient Instructions (Signed)
I think you have signs and symptoms of mild parkinsonism, possibly right sided. Parkinson's disease.   Please continue with your healthy lifestyle.   I do want to suggest a few things today:   Please be really proactive with your constipation medication regimen, titrating as needed to where you have a formed stool at least every other day.   Remember to drink plenty of fluid at least 6 glasses (8 oz each), eat healthy meals and do not skip any meals. Try to eat protein with a every meal and eat a healthy snack such as fruit or nuts in between meals. Try to keep a regular sleep-wake schedule and try to exercise daily, particularly in the form of walking, 20-30 minutes a day, if you can.   Try to stay active physically and mentally. Engage in social activities in your community and with your family and try to keep up with current events by reading the newspaper or watching the news. Try to do word puzzles and you may like to do puzzles and brain games on the computer such as on [email or web address]   As far as diagnostic testing, I will order: We will do a brain scan, called MRI and call you with the test results. We will have to schedule you for this on a separate date. This test requires authorization from your insurance, and we will take care of the insurance process.  DaT scan: This is a specialized brain scan designed to help with diagnosis of tremor disorders. A radioactive marker gets injected and the uptake is measured in the brain and compared to normal controls and right side is compared to the left, a change in uptake can help with diagnosis of certain tremor disorders. A brain MRI on the other hand is a brain scan that helps look at the brain structure in more detail overall and look for age-related changes, blood vessel related changes and look for stroke and volume loss which we call atrophy.   I would like to see you back in about 4 months, sooner if we need to. Please call us with any interim  questions, concerns, problems, updates or refill requests.   Our phone number is [phone]. We also have an after hours call service for urgent matters and there is a physician on-call for urgent questions, that cannot wait till the next work day. For any emergencies you know to call 911 or go to the nearest emergency room.

## 2018-12-07 NOTE — Telephone Encounter (Signed)
Called and spoke to patient's husband Abigail Flores and relayed referral had been sent .

## 2018-12-07 NOTE — Telephone Encounter (Signed)
VO from Dr. Rexene Alberts received to place referral for second opinion for parkinsonism to Dr. Carles Collet. Order placed.  Hinton Dyer, please call pt and let her know when this referral has been sent to [facility].

## 2018-12-08 ENCOUNTER — Telehealth: Payer: Self-pay | Admitting: Neurology

## 2018-12-08 LAB — COMPREHENSIVE METABOLIC PANEL
ALK PHOS: 101 IU/L (ref 39–117)
ALT: 14 IU/L (ref 0–32)
AST: 18 IU/L (ref 0–40)
Albumin/Globulin Ratio: 2.5 — ABNORMAL HIGH (ref 1.2–2.2)
Albumin: 4.8 g/dL (ref 3.8–4.9)
BILIRUBIN TOTAL: 0.2 mg/dL (ref 0.0–1.2)
BUN/Creatinine Ratio: 14 (ref 9–23)
BUN: 11 mg/dL (ref 6–24)
CHLORIDE: 101 mmol/L (ref 96–106)
CO2: 24 mmol/L (ref 20–29)
Calcium: 9.9 mg/dL (ref 8.7–10.2)
Creatinine, Ser: 0.79 mg/dL (ref 0.57–1.00)
GFR calc Af Amer: 95 mL/min/{1.73_m2} (ref >59)
GFR calc non Af Amer: 83 mL/min/{1.73_m2} (ref >59)
GLUCOSE: 119 mg/dL — AB (ref 65–99)
Globulin, Total: 1.9 g/dL (ref 1.5–4.5)
Potassium: 4.4 mmol/L (ref 3.5–5.2)
Sodium: 142 mmol/L (ref 134–144)
Total Protein: 6.7 g/dL (ref 6.0–8.5)

## 2018-12-08 NOTE — Telephone Encounter (Signed)
pt wanted [facility]. she is scheduled for 12/15/18 arrival time is 8:30 AM pt is aware. Faxed order fax # [phone] & ph # [phone].

## 2018-12-08 NOTE — Telephone Encounter (Signed)
spoke to the patient she wants to speak with her husband first and then get back to me about scheduling  [employer] Auth: [place] via [employer]

## 2018-12-15 ENCOUNTER — Telehealth: Payer: Self-pay

## 2018-12-15 DIAGNOSIS — G20 Parkinson's disease: Secondary | ICD-10-CM | POA: Diagnosis not present

## 2018-12-15 NOTE — Telephone Encounter (Signed)
Received MRI results from [facility].

## 2018-12-15 NOTE — Telephone Encounter (Signed)
I received her brain MRI report from exam dated 12/15/2018. This was done at [facility], she had a brain MRI with and without contrast. Impression: Mild nonspecific white matter disease. No suspicious areas of enhancement. No acute infarct, hemorrhage, edema or mass.  Please call patient and update her that her recent brain MRI with and without contrast showed no acute findings, age-appropriate findings were seen, no further action is required on her brain scan at this time.  I recommend that we proceed with DaT scan next month, as scheduled, and second opinion appointment with Dr. Carles Collet later this month.

## 2018-12-16 NOTE — Telephone Encounter (Signed)
I called pt, spoke to pt's husband, Kasandra Knudsen, per DPR, and advised him of pt's MRI results. Pt will proceed with DaT scan and see Dr.Tat next month. Pt's husband verbalized understanding of results and had no questions at this time.

## 2018-12-21 ENCOUNTER — Ambulatory Visit: Payer: 59 | Admitting: Neurology

## 2019-01-07 ENCOUNTER — Encounter: Payer: Self-pay | Admitting: Neurology

## 2019-01-14 ENCOUNTER — Encounter (HOSPITAL_COMMUNITY): Payer: BLUE CROSS/BLUE SHIELD

## 2019-01-18 DIAGNOSIS — E039 Hypothyroidism, unspecified: Secondary | ICD-10-CM | POA: Diagnosis not present

## 2019-01-18 DIAGNOSIS — I10 Essential (primary) hypertension: Secondary | ICD-10-CM | POA: Diagnosis not present

## 2019-01-18 DIAGNOSIS — E785 Hyperlipidemia, unspecified: Secondary | ICD-10-CM | POA: Diagnosis not present

## 2019-01-25 ENCOUNTER — Ambulatory Visit: Payer: Self-pay | Admitting: Neurology

## 2019-02-11 ENCOUNTER — Other Ambulatory Visit: Payer: Self-pay

## 2019-02-11 ENCOUNTER — Ambulatory Visit (HOSPITAL_COMMUNITY)
Admission: RE | Admit: 2019-02-11 | Discharge: 2019-02-11 | Disposition: A | Discharge status: Home / Self Care | Payer: BLUE CROSS/BLUE SHIELD | Source: Ambulatory Visit | Attending: Neurology | Admitting: Neurology

## 2019-02-11 ENCOUNTER — Encounter (HOSPITAL_COMMUNITY)
Admission: RE | Admit: 2019-02-11 | Discharge: 2019-02-11 | Disposition: A | Discharge status: Home / Self Care | Payer: BLUE CROSS/BLUE SHIELD | Source: Ambulatory Visit | Attending: Neurology | Admitting: Neurology

## 2019-02-11 ENCOUNTER — Telehealth: Payer: Self-pay

## 2019-02-11 DIAGNOSIS — G20 Parkinson's disease: Secondary | ICD-10-CM | POA: Insufficient documentation

## 2019-02-11 DIAGNOSIS — R251 Tremor, unspecified: Secondary | ICD-10-CM | POA: Diagnosis not present

## 2019-02-11 MED ORDER — IOFLUPANE I 123 185 MBQ/2.5ML IV SOLN
4.7000 | Freq: Once | INTRAVENOUS | Status: AC | PRN
  Administered 2019-02-11: 10:00:00 4.7 via INTRAVENOUS
  Filled 2019-02-11: qty 5

## 2019-02-11 MED ORDER — IODINE STRONG (LUGOLS) 5 % PO SOLN
0.8000 mL | Freq: Once | ORAL | Status: AC
  Administered 2019-02-11: 0.8 mL via ORAL
  Filled 2019-02-11: qty 0.8

## 2019-02-11 NOTE — Telephone Encounter (Signed)
-----   Message from Star Age, MD sent at 02/11/2019  4:29 PM EDT ----- Please call pt regarding the recent DaT scan: the radioactive marker uptake pattern was in keeping with parkinsonism, and supports the diagnosis of Parkinson's disease. Please offer VV or in office visit to discuss further, ie treatment option, etc.  Star Age, MD, PhD [facility] ([place])

## 2019-02-11 NOTE — Telephone Encounter (Signed)
I called pt to discuss her DaT scan results. No answer, left a message asking her to call me back.

## 2019-02-11 NOTE — Progress Notes (Signed)
Please call pt regarding the recent DaT scan: the radioactive marker uptake pattern was in keeping with parkinsonism, and supports the diagnosis of Parkinson's disease. Please offer VV or in office visit to discuss further, ie treatment option, etc.  Abigail Age, MD, PhD [facility] ([place])

## 2019-02-16 NOTE — Telephone Encounter (Signed)
I called pt, spoke with pt's husband, Abigail Flores, per DPR, explained pt's DaT scan results. Pt has a second opinion appt on 6/15 with Dr. Carles Collet but pt's husband is requesting a sooner appt with Dr. Rexene Alberts to discuss this further. Pt's husband prefers an in office appt, it was scheduled for 03/01/19 at 9:00am. Pt's husband verbalized understanding of results and had no questions at this time.

## 2019-03-01 ENCOUNTER — Ambulatory Visit: Payer: Self-pay | Admitting: Neurology

## 2019-03-01 ENCOUNTER — Encounter: Payer: Self-pay | Admitting: Neurology

## 2019-03-01 ENCOUNTER — Other Ambulatory Visit: Payer: Self-pay

## 2019-03-01 ENCOUNTER — Telehealth: Payer: Self-pay | Admitting: Neurology

## 2019-03-01 NOTE — Telephone Encounter (Signed)
IN OFFICE: f/u 03-01-19 Pt husband has called, he gave verbal consent to file insurance for doxy[employer]me vv Email confirmed as:[email or web address]  Pt understands that although there may be some limitations with this type of visit, we will take all precautions to reduce any security or privacy concerns.  Pt understands that this will be treated like an in office visit and we will file with pt's insurance, and there may be a patient responsible charge related to this service. *email being sent*

## 2019-03-01 NOTE — Telephone Encounter (Signed)
I was transferred the call, spoke to pt's husband, Kasandra Knudsen, per [facility]. Pt's meds, allergies, and PMH were updated.  Pt's husband reports that pt is doing well, wants to speak with Dr. Rexene Alberts before the second opinion with Dr. Carles Collet regarding DaT scan results and tx options.  Pt's husband verbalized understanding of the doxy.me process.

## 2019-03-01 NOTE — Telephone Encounter (Signed)
Pt could not get doxy[employer]me to work. Pt was offered an in office visit today, or after pt's second opinion with Dr. Carles Collet. Pt would prefer an appt before the second opinion. An in office appt was scheduled for 03/08/19 at 9:00am. Pt verbalized understanding of new appt date and time.

## 2019-03-01 NOTE — Progress Notes (Signed)
Patient declined an in-office visit scheduled for today and not able to participate in virtual visit scheduled for today.

## 2019-03-08 ENCOUNTER — Ambulatory Visit: Payer: Self-pay | Admitting: Neurology

## 2019-03-15 ENCOUNTER — Encounter: Payer: Self-pay | Admitting: Neurology

## 2019-03-15 ENCOUNTER — Ambulatory Visit: Payer: BLUE CROSS/BLUE SHIELD | Admitting: Neurology

## 2019-03-15 ENCOUNTER — Other Ambulatory Visit: Payer: Self-pay

## 2019-03-15 VITALS — BP 167/96 | HR 94 | Ht 64.0 in | Wt 185.0 lb

## 2019-03-15 DIAGNOSIS — G20 Parkinson's disease: Secondary | ICD-10-CM

## 2019-03-15 NOTE — Patient Instructions (Signed)
Your history and examination as well as recent DaT scan findings are supportive of Parkinson's disease, but findings continue to be mild. Please keep your Second opinion appointment with Dr. Carles Collet next week. You can continue with her if you both prefer or we can schedule a follow up with me.   Remember to drink plenty of fluid at least 6 glasses (8 oz each), eat healthy meals and do not skip any meals. Try to eat protein with a every meal and eat a healthy snack such as fruit or nuts in between meals. Try to keep a regular sleep-wake schedule and try to exercise daily, particularly in the form of walking, 20-30 minutes a day, if you can. Try to stay active physically and mentally.  Our phone number is [phone]. We also have an after hours call service for urgent matters and there is a physician on-call for urgent questions, that cannot wait till the next work day. For any emergencies you know to call 911 or go to the nearest emergency room.
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
## 2019-03-22 ENCOUNTER — Other Ambulatory Visit: Payer: Self-pay

## 2019-03-22 ENCOUNTER — Ambulatory Visit: Payer: BC Managed Care – PPO | Admitting: Neurology

## 2019-03-22 VITALS — BP 136/86 | HR 79 | Temp 98.0°F | Ht 64.0 in | Wt 180.6 lb

## 2019-03-22 DIAGNOSIS — G4701 Insomnia due to medical condition: Secondary | ICD-10-CM

## 2019-03-22 DIAGNOSIS — G20 Parkinson's disease: Secondary | ICD-10-CM | POA: Diagnosis not present

## 2019-03-22 DIAGNOSIS — G20A1 Parkinson's disease without dyskinesia, without mention of fluctuations: Secondary | ICD-10-CM

## 2019-03-22 DIAGNOSIS — E039 Hypothyroidism, unspecified: Secondary | ICD-10-CM

## 2019-03-22 MED ORDER — PRAMIPEXOLE DIHYDROCHLORIDE 0.5 MG PO TABS: 0.5000 mg | ORAL_TABLET | Freq: Three times a day (TID) | ORAL | 1 refills | Status: DC

## 2019-03-22 MED ORDER — PRAMIPEXOLE DIHYDROCHLORIDE 0.125 MG PO TABS: ORAL_TABLET | ORAL | 0 refills | Status: DC

## 2019-03-22 NOTE — Patient Instructions (Addendum)
Start mirapex (pramipexole) as follows:  0.125 mg - 1 tablet three times per day for a week, then 2 tablets three times per day for a week and then fill the 0.5 mg tablet and take that, 1 pill three times per day

## 2019-03-24 ENCOUNTER — Telehealth: Payer: Self-pay | Admitting: Neurology

## 2019-03-24 NOTE — Telephone Encounter (Signed)
Instructions  Start mirapex (pramipexole) as follows:  0.125 mg - 1 tablet three times per day for a week, then 2 tablets three times per day for a week and then fill the 0.5 mg tablet and take that, 1 pill three times per day

## 2019-03-24 NOTE — Telephone Encounter (Signed)
Husband left VM about medication and dosage questions. He said it was for the new medication that was just prescribed. Please call him back at [phone]. Thanks!

## 2019-03-24 NOTE — Telephone Encounter (Signed)
Pt spouse was given instruction via AVS from last office visit. Pt spouse was also informed that this was printed on patient AVS if he forgets or is confused

## 2019-04-19 ENCOUNTER — Encounter: Payer: Self-pay | Admitting: Obstetrics and Gynecology

## 2019-04-19 DIAGNOSIS — Z1231 Encounter for screening mammogram for malignant neoplasm of breast: Secondary | ICD-10-CM | POA: Diagnosis not present

## 2019-04-26 ENCOUNTER — Ambulatory Visit: Payer: BLUE CROSS/BLUE SHIELD | Admitting: Neurology

## 2019-04-26 DIAGNOSIS — Z713 Dietary counseling and surveillance: Secondary | ICD-10-CM | POA: Diagnosis not present

## 2019-04-26 DIAGNOSIS — E039 Hypothyroidism, unspecified: Secondary | ICD-10-CM | POA: Diagnosis not present

## 2019-04-26 DIAGNOSIS — R635 Abnormal weight gain: Secondary | ICD-10-CM | POA: Diagnosis not present

## 2019-04-26 DIAGNOSIS — I10 Essential (primary) hypertension: Secondary | ICD-10-CM | POA: Diagnosis not present

## 2019-04-26 DIAGNOSIS — E785 Hyperlipidemia, unspecified: Secondary | ICD-10-CM | POA: Diagnosis not present

## 2019-04-26 DIAGNOSIS — Z6830 Body mass index (BMI) 30.0-30.9, adult: Secondary | ICD-10-CM | POA: Diagnosis not present

## 2019-05-30 DIAGNOSIS — S61210A Laceration without foreign body of right index finger without damage to nail, initial encounter: Secondary | ICD-10-CM | POA: Diagnosis not present

## 2019-08-18 ENCOUNTER — Telehealth: Payer: Self-pay | Admitting: Neurology

## 2019-08-18 NOTE — Telephone Encounter (Signed)
In getting for my charts next week (and transition to virtual visits via [facility] request), I noted that pt has NP appt at [facility] with Dr. Anna Genre on 12/7.  Pt appears to be looking for diagnosis other than PD.  She saw Dr. Rexene Alberts and then me one time and now transitioning care to [facility].  Please let pt know that we will cancel her upcoming appt since she is transferring care to [facility] and wish her the best.

## 2019-08-23 ENCOUNTER — Ambulatory Visit: Payer: BC Managed Care – PPO | Admitting: Neurology

## 2019-08-23 ENCOUNTER — Encounter: Payer: Self-pay | Admitting: Neurology

## 2019-08-25 ENCOUNTER — Telehealth: Payer: Self-pay | Admitting: Neurology

## 2019-08-25 NOTE — Telephone Encounter (Signed)
Patient dismissed from [facility] by Wells Guiles Tat, DO, effective 08/23/19. Dismissal Letter sent out by 1st class mail. KLM

## 2019-08-30 ENCOUNTER — Encounter: Payer: Self-pay | Admitting: Obstetrics and Gynecology

## 2019-08-30 ENCOUNTER — Other Ambulatory Visit: Payer: Self-pay

## 2019-08-30 ENCOUNTER — Ambulatory Visit (INDEPENDENT_AMBULATORY_CARE_PROVIDER_SITE_OTHER): Payer: BC Managed Care – PPO | Admitting: Obstetrics and Gynecology

## 2019-08-30 VITALS — BP 126/82 | HR 72 | Temp 97.2°F | Ht 63.5 in | Wt 180.8 lb

## 2019-08-30 DIAGNOSIS — Z01419 Encounter for gynecological examination (general) (routine) without abnormal findings: Secondary | ICD-10-CM | POA: Diagnosis not present
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
## 2019-08-30 NOTE — Patient Instructions (Signed)

## 2019-10-04 DIAGNOSIS — Z20828 Contact with and (suspected) exposure to other viral communicable diseases: Secondary | ICD-10-CM | POA: Diagnosis not present

## 2019-12-22 ENCOUNTER — Encounter: Payer: Self-pay | Admitting: Certified Nurse Midwife

## 2020-04-17 DIAGNOSIS — R8279 Other abnormal findings on microbiological examination of urine: Secondary | ICD-10-CM | POA: Diagnosis not present

## 2020-04-17 DIAGNOSIS — E039 Hypothyroidism, unspecified: Secondary | ICD-10-CM | POA: Diagnosis not present

## 2020-04-17 DIAGNOSIS — I10 Essential (primary) hypertension: Secondary | ICD-10-CM | POA: Diagnosis not present

## 2020-04-17 DIAGNOSIS — R350 Frequency of micturition: Secondary | ICD-10-CM | POA: Diagnosis not present

## 2020-04-24 ENCOUNTER — Encounter: Payer: Self-pay | Admitting: Obstetrics and Gynecology

## 2020-04-24 DIAGNOSIS — R8279 Other abnormal findings on microbiological examination of urine: Secondary | ICD-10-CM | POA: Diagnosis not present

## 2020-04-24 DIAGNOSIS — I10 Essential (primary) hypertension: Secondary | ICD-10-CM | POA: Diagnosis not present

## 2020-04-24 DIAGNOSIS — Z1231 Encounter for screening mammogram for malignant neoplasm of breast: Secondary | ICD-10-CM | POA: Diagnosis not present

## 2020-04-24 DIAGNOSIS — E039 Hypothyroidism, unspecified: Secondary | ICD-10-CM | POA: Diagnosis not present

## 2020-04-24 DIAGNOSIS — E782 Mixed hyperlipidemia: Secondary | ICD-10-CM | POA: Diagnosis not present

## 2020-04-24 DIAGNOSIS — R399 Unspecified symptoms and signs involving the genitourinary system: Secondary | ICD-10-CM | POA: Diagnosis not present

## 2020-05-10 DIAGNOSIS — L298 Other pruritus: Secondary | ICD-10-CM | POA: Diagnosis not present

## 2020-05-10 DIAGNOSIS — L03115 Cellulitis of right lower limb: Secondary | ICD-10-CM | POA: Diagnosis not present

## 2020-05-10 DIAGNOSIS — L03116 Cellulitis of left lower limb: Secondary | ICD-10-CM | POA: Diagnosis not present

## 2020-07-07 ENCOUNTER — Telehealth: Payer: Self-pay | Admitting: Neurology

## 2020-07-07 NOTE — Telephone Encounter (Signed)
Earnest,Danny(husband on DPR) has called to report pt has had difficulty accepting the diagnosis from Dr Rexene Alberts as to why it has been so long since they have called.  Husband states he has gotten pt to be willing to go get a 2nd opinion.  Husband is asking if a referral can be sent to Dr Jose Persia @ [facility] ph [phone] fax# [phone]

## 2020-07-10 NOTE — Telephone Encounter (Signed)
We had agreed for the second opinion last year and patient had been referred to Dr. Carles Collet.  She had an appointment last year.  She transferred care to Dr. Carles Collet, and was started on Mirapex. It may be best if patient request her third opinion from her primary care physician at this point.  She was also scheduled at [facility] with Dr. Anna Genre from what I can see.  Please relay information back to patient or her husband.

## 2020-07-10 NOTE — Telephone Encounter (Signed)
Pt's husband advised of Dr. Guadelupe Sabin recommendation. He verbalized understanding and will contact pcp.

## 2020-09-01 ENCOUNTER — Telehealth: Payer: Self-pay

## 2020-09-01 NOTE — Telephone Encounter (Signed)
08/2019 with JJ, next 09/04/20 with JJ Mild vaginal atrophy, no HRT  Spoke with pt. Pt states having swollen, burning, painful on labia and clitoris x 2 days. Pt denies vaginal discharge, odor, fever, abd pain or cramps. Denies being SA. Pt has used cold compress that helps, but does not resolve. Denies redness, bumps or lumps. Denies HSV hx.  Pt advised to have OV. Pt declines due to having AEX with Dr Talbert Nan. Pt states will take Ibuprofen over weekend. Advised to seek UC if sx worsen over weekend. Pt agreeable.  Advised not to put any ointments, creams and no OTC treatments. Pt verbalized understanding.  Routing to Dr Talbert Nan for review Encounter closed

## 2020-09-01 NOTE — Telephone Encounter (Signed)
Patient wants to speak with the nurse she is having discomfort and pain. Patient states her vulva is swollen, itching and burning. Patient wants to know what can she use other the counter . She is scheduled 09/04/20 for her annual with Dr. Talbert Nan.

## 2020-09-01 NOTE — Telephone Encounter (Signed)
Attempted to return call to pt x 2 on both numbers. Number states" unallocated number and hangs up"  Pt not mychart active. Will wait for pt to return call.

## 2020-09-04 ENCOUNTER — Other Ambulatory Visit: Payer: Self-pay

## 2020-09-04 ENCOUNTER — Encounter: Payer: Self-pay | Admitting: Obstetrics and Gynecology

## 2020-09-04 ENCOUNTER — Ambulatory Visit: Payer: BC Managed Care – PPO | Admitting: Obstetrics and Gynecology

## 2020-09-04 VITALS — BP 130/72 | HR 92 | Ht 63.0 in | Wt 180.4 lb

## 2020-09-04 DIAGNOSIS — Z01419 Encounter for gynecological examination (general) (routine) without abnormal findings: Secondary | ICD-10-CM | POA: Diagnosis not present

## 2020-09-04 DIAGNOSIS — Z803 Family history of malignant neoplasm of breast: Secondary | ICD-10-CM

## 2020-09-04 NOTE — Patient Instructions (Signed)
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
## 2020-10-09 DIAGNOSIS — M9903 Segmental and somatic dysfunction of lumbar region: Secondary | ICD-10-CM | POA: Diagnosis not present

## 2020-10-09 DIAGNOSIS — M546 Pain in thoracic spine: Secondary | ICD-10-CM | POA: Diagnosis not present

## 2020-10-09 DIAGNOSIS — M9904 Segmental and somatic dysfunction of sacral region: Secondary | ICD-10-CM | POA: Diagnosis not present

## 2020-10-09 DIAGNOSIS — M9902 Segmental and somatic dysfunction of thoracic region: Secondary | ICD-10-CM | POA: Diagnosis not present

## 2020-10-10 DIAGNOSIS — M9904 Segmental and somatic dysfunction of sacral region: Secondary | ICD-10-CM | POA: Diagnosis not present

## 2020-10-10 DIAGNOSIS — M9902 Segmental and somatic dysfunction of thoracic region: Secondary | ICD-10-CM | POA: Diagnosis not present

## 2020-10-10 DIAGNOSIS — M9903 Segmental and somatic dysfunction of lumbar region: Secondary | ICD-10-CM | POA: Diagnosis not present

## 2020-10-10 DIAGNOSIS — M546 Pain in thoracic spine: Secondary | ICD-10-CM | POA: Diagnosis not present

## 2020-10-12 DIAGNOSIS — M9903 Segmental and somatic dysfunction of lumbar region: Secondary | ICD-10-CM | POA: Diagnosis not present

## 2020-10-12 DIAGNOSIS — M9902 Segmental and somatic dysfunction of thoracic region: Secondary | ICD-10-CM | POA: Diagnosis not present

## 2020-10-12 DIAGNOSIS — M546 Pain in thoracic spine: Secondary | ICD-10-CM | POA: Diagnosis not present

## 2020-10-12 DIAGNOSIS — M9904 Segmental and somatic dysfunction of sacral region: Secondary | ICD-10-CM | POA: Diagnosis not present

## 2020-10-23 DIAGNOSIS — C022 Malignant neoplasm of ventral surface of tongue: Secondary | ICD-10-CM | POA: Diagnosis not present

## 2020-11-06 DIAGNOSIS — C022 Malignant neoplasm of ventral surface of tongue: Secondary | ICD-10-CM | POA: Diagnosis not present

## 2020-11-08 ENCOUNTER — Ambulatory Visit: Payer: BC Managed Care – PPO | Admitting: Internal Medicine

## 2020-11-08 ENCOUNTER — Encounter: Payer: Self-pay | Admitting: Internal Medicine

## 2020-11-08 ENCOUNTER — Other Ambulatory Visit: Payer: Self-pay

## 2020-11-08 VITALS — BP 124/82 | HR 100 | Ht 62.5 in | Wt 175.0 lb

## 2020-11-08 DIAGNOSIS — R251 Tremor, unspecified: Secondary | ICD-10-CM

## 2020-11-08 DIAGNOSIS — F439 Reaction to severe stress, unspecified: Secondary | ICD-10-CM | POA: Diagnosis not present

## 2020-11-08 DIAGNOSIS — C029 Malignant neoplasm of tongue, unspecified: Secondary | ICD-10-CM | POA: Diagnosis not present

## 2020-11-08 DIAGNOSIS — K625 Hemorrhage of anus and rectum: Secondary | ICD-10-CM | POA: Diagnosis not present

## 2020-11-08 MED ORDER — HYDROCORTISONE (PERIANAL) 2.5 % EX CREA: 1.0000 "application " | TOPICAL_CREAM | Freq: Two times a day (BID) | CUTANEOUS | 1 refills | Status: DC

## 2020-11-08 NOTE — Patient Instructions (Addendum)
We have sent the following medications to your pharmacy for you to pick up at your convenience: [facility]  Call us back when you know your other appointments and we will arrange a colonoscopy appointment for you along with a pre-visit to get instructions.  We have requested your records from Dr Serita Grit office for review.   I appreciate the opportunity to care for you. Silvano Rusk, MD, [facility]
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
## 2020-11-10 DIAGNOSIS — C029 Malignant neoplasm of tongue, unspecified: Secondary | ICD-10-CM | POA: Diagnosis not present

## 2020-11-20 DIAGNOSIS — C029 Malignant neoplasm of tongue, unspecified: Secondary | ICD-10-CM | POA: Diagnosis not present

## 2020-11-29 DIAGNOSIS — C029 Malignant neoplasm of tongue, unspecified: Secondary | ICD-10-CM | POA: Diagnosis not present

## 2020-12-06 DIAGNOSIS — N390 Urinary tract infection, site not specified: Secondary | ICD-10-CM | POA: Diagnosis not present

## 2020-12-17 DIAGNOSIS — C029 Malignant neoplasm of tongue, unspecified: Secondary | ICD-10-CM | POA: Insufficient documentation

## 2020-12-17 DIAGNOSIS — R7309 Other abnormal glucose: Secondary | ICD-10-CM | POA: Insufficient documentation

## 2020-12-22 DIAGNOSIS — C029 Malignant neoplasm of tongue, unspecified: Secondary | ICD-10-CM | POA: Diagnosis not present

## 2020-12-22 DIAGNOSIS — E039 Hypothyroidism, unspecified: Secondary | ICD-10-CM | POA: Diagnosis not present

## 2020-12-22 DIAGNOSIS — I10 Essential (primary) hypertension: Secondary | ICD-10-CM | POA: Diagnosis not present

## 2020-12-22 DIAGNOSIS — R9431 Abnormal electrocardiogram [ECG] [EKG]: Secondary | ICD-10-CM | POA: Diagnosis not present

## 2020-12-22 DIAGNOSIS — R7309 Other abnormal glucose: Secondary | ICD-10-CM | POA: Diagnosis not present

## 2020-12-22 DIAGNOSIS — C022 Malignant neoplasm of ventral surface of tongue: Secondary | ICD-10-CM | POA: Diagnosis not present

## 2020-12-22 DIAGNOSIS — Z01818 Encounter for other preprocedural examination: Secondary | ICD-10-CM | POA: Diagnosis not present

## 2020-12-22 DIAGNOSIS — Z96652 Presence of left artificial knee joint: Secondary | ICD-10-CM | POA: Diagnosis not present

## 2020-12-22 DIAGNOSIS — Z131 Encounter for screening for diabetes mellitus: Secondary | ICD-10-CM | POA: Diagnosis not present

## 2020-12-22 DIAGNOSIS — E038 Other specified hypothyroidism: Secondary | ICD-10-CM | POA: Diagnosis not present

## 2020-12-22 DIAGNOSIS — I517 Cardiomegaly: Secondary | ICD-10-CM | POA: Diagnosis not present

## 2020-12-25 DIAGNOSIS — C022 Malignant neoplasm of ventral surface of tongue: Secondary | ICD-10-CM | POA: Diagnosis not present

## 2020-12-25 DIAGNOSIS — C029 Malignant neoplasm of tongue, unspecified: Secondary | ICD-10-CM | POA: Diagnosis not present

## 2020-12-25 DIAGNOSIS — Z7982 Long term (current) use of aspirin: Secondary | ICD-10-CM | POA: Diagnosis not present

## 2020-12-25 DIAGNOSIS — E785 Hyperlipidemia, unspecified: Secondary | ICD-10-CM | POA: Diagnosis not present

## 2020-12-25 DIAGNOSIS — I10 Essential (primary) hypertension: Secondary | ICD-10-CM | POA: Diagnosis not present

## 2020-12-25 DIAGNOSIS — R110 Nausea: Secondary | ICD-10-CM | POA: Diagnosis not present

## 2020-12-25 DIAGNOSIS — K567 Ileus, unspecified: Secondary | ICD-10-CM | POA: Diagnosis not present

## 2020-12-25 DIAGNOSIS — Z9889 Other specified postprocedural states: Secondary | ICD-10-CM | POA: Diagnosis not present

## 2020-12-25 DIAGNOSIS — R112 Nausea with vomiting, unspecified: Secondary | ICD-10-CM | POA: Diagnosis not present

## 2020-12-25 DIAGNOSIS — Z4682 Encounter for fitting and adjustment of non-vascular catheter: Secondary | ICD-10-CM | POA: Diagnosis not present

## 2020-12-25 DIAGNOSIS — R491 Aphonia: Secondary | ICD-10-CM | POA: Diagnosis not present

## 2020-12-25 DIAGNOSIS — G20 Parkinson's disease: Secondary | ICD-10-CM | POA: Diagnosis not present

## 2020-12-25 DIAGNOSIS — Z87891 Personal history of nicotine dependence: Secondary | ICD-10-CM | POA: Diagnosis not present

## 2020-12-25 DIAGNOSIS — C069 Malignant neoplasm of mouth, unspecified: Secondary | ICD-10-CM | POA: Diagnosis not present

## 2020-12-25 DIAGNOSIS — Z79899 Other long term (current) drug therapy: Secondary | ICD-10-CM | POA: Diagnosis not present

## 2020-12-25 DIAGNOSIS — Z20822 Contact with and (suspected) exposure to covid-19: Secondary | ICD-10-CM | POA: Diagnosis not present

## 2020-12-25 DIAGNOSIS — E039 Hypothyroidism, unspecified: Secondary | ICD-10-CM | POA: Diagnosis not present

## 2020-12-25 DIAGNOSIS — R1310 Dysphagia, unspecified: Secondary | ICD-10-CM | POA: Diagnosis not present

## 2020-12-25 HISTORY — PX: OTHER SURGICAL HISTORY: SHX169

## 2021-01-02 DIAGNOSIS — C069 Malignant neoplasm of mouth, unspecified: Secondary | ICD-10-CM | POA: Diagnosis not present

## 2021-01-02 DIAGNOSIS — R1310 Dysphagia, unspecified: Secondary | ICD-10-CM | POA: Diagnosis not present

## 2021-01-02 DIAGNOSIS — R491 Aphonia: Secondary | ICD-10-CM | POA: Diagnosis not present

## 2021-01-10 DIAGNOSIS — C029 Malignant neoplasm of tongue, unspecified: Secondary | ICD-10-CM | POA: Diagnosis not present

## 2021-01-17 ENCOUNTER — Encounter: Payer: Self-pay | Admitting: Internal Medicine

## 2021-03-05 DIAGNOSIS — Z923 Personal history of irradiation: Secondary | ICD-10-CM | POA: Diagnosis not present

## 2021-03-05 DIAGNOSIS — C029 Malignant neoplasm of tongue, unspecified: Secondary | ICD-10-CM | POA: Diagnosis not present

## 2021-03-05 DIAGNOSIS — Z6827 Body mass index (BMI) 27.0-27.9, adult: Secondary | ICD-10-CM | POA: Diagnosis not present

## 2021-03-05 DIAGNOSIS — X58XXXD Exposure to other specified factors, subsequent encounter: Secondary | ICD-10-CM | POA: Diagnosis not present

## 2021-03-05 DIAGNOSIS — Z9889 Other specified postprocedural states: Secondary | ICD-10-CM | POA: Diagnosis not present

## 2021-03-05 DIAGNOSIS — Z08 Encounter for follow-up examination after completed treatment for malignant neoplasm: Secondary | ICD-10-CM | POA: Diagnosis not present

## 2021-03-05 DIAGNOSIS — Z85810 Personal history of malignant neoplasm of tongue: Secondary | ICD-10-CM | POA: Diagnosis not present

## 2021-03-05 DIAGNOSIS — S51801D Unspecified open wound of right forearm, subsequent encounter: Secondary | ICD-10-CM | POA: Diagnosis not present

## 2021-03-05 DIAGNOSIS — R634 Abnormal weight loss: Secondary | ICD-10-CM | POA: Diagnosis not present

## 2021-03-06 DIAGNOSIS — C01 Malignant neoplasm of base of tongue: Secondary | ICD-10-CM | POA: Diagnosis not present

## 2021-03-06 DIAGNOSIS — I10 Essential (primary) hypertension: Secondary | ICD-10-CM | POA: Diagnosis not present

## 2021-03-06 DIAGNOSIS — R634 Abnormal weight loss: Secondary | ICD-10-CM | POA: Diagnosis not present

## 2021-03-06 DIAGNOSIS — Z6826 Body mass index (BMI) 26.0-26.9, adult: Secondary | ICD-10-CM | POA: Diagnosis not present

## 2021-03-12 DIAGNOSIS — M67361 Transient synovitis, right knee: Secondary | ICD-10-CM | POA: Diagnosis not present

## 2021-03-12 DIAGNOSIS — M25561 Pain in right knee: Secondary | ICD-10-CM | POA: Diagnosis not present

## 2021-03-12 DIAGNOSIS — M1711 Unilateral primary osteoarthritis, right knee: Secondary | ICD-10-CM | POA: Diagnosis not present

## 2021-03-12 DIAGNOSIS — E663 Overweight: Secondary | ICD-10-CM | POA: Diagnosis not present

## 2021-03-19 ENCOUNTER — Encounter: Payer: Self-pay | Admitting: Internal Medicine

## 2021-03-26 DIAGNOSIS — I10 Essential (primary) hypertension: Secondary | ICD-10-CM | POA: Diagnosis not present

## 2021-03-26 DIAGNOSIS — N390 Urinary tract infection, site not specified: Secondary | ICD-10-CM | POA: Diagnosis not present

## 2021-03-26 DIAGNOSIS — Z6826 Body mass index (BMI) 26.0-26.9, adult: Secondary | ICD-10-CM | POA: Diagnosis not present

## 2021-03-26 DIAGNOSIS — Z8619 Personal history of other infectious and parasitic diseases: Secondary | ICD-10-CM | POA: Diagnosis not present

## 2021-04-19 DIAGNOSIS — C029 Malignant neoplasm of tongue, unspecified: Secondary | ICD-10-CM | POA: Diagnosis not present

## 2021-04-23 DIAGNOSIS — M546 Pain in thoracic spine: Secondary | ICD-10-CM | POA: Diagnosis not present

## 2021-04-23 DIAGNOSIS — M1711 Unilateral primary osteoarthritis, right knee: Secondary | ICD-10-CM | POA: Diagnosis not present

## 2021-04-24 ENCOUNTER — Encounter: Payer: Self-pay | Admitting: Internal Medicine

## 2021-04-30 DIAGNOSIS — Z1231 Encounter for screening mammogram for malignant neoplasm of breast: Secondary | ICD-10-CM | POA: Diagnosis not present

## 2021-05-07 DIAGNOSIS — M546 Pain in thoracic spine: Secondary | ICD-10-CM | POA: Diagnosis not present

## 2021-05-07 DIAGNOSIS — M545 Low back pain, unspecified: Secondary | ICD-10-CM | POA: Diagnosis not present

## 2021-05-14 ENCOUNTER — Encounter: Payer: BC Managed Care – PPO | Admitting: Internal Medicine

## 2021-05-14 DIAGNOSIS — M546 Pain in thoracic spine: Secondary | ICD-10-CM | POA: Diagnosis not present

## 2021-05-28 DIAGNOSIS — Z6827 Body mass index (BMI) 27.0-27.9, adult: Secondary | ICD-10-CM | POA: Diagnosis not present

## 2021-05-28 DIAGNOSIS — I10 Essential (primary) hypertension: Secondary | ICD-10-CM | POA: Diagnosis not present

## 2021-05-28 DIAGNOSIS — E039 Hypothyroidism, unspecified: Secondary | ICD-10-CM | POA: Diagnosis not present

## 2021-05-28 DIAGNOSIS — R252 Cramp and spasm: Secondary | ICD-10-CM | POA: Diagnosis not present

## 2021-05-28 DIAGNOSIS — E782 Mixed hyperlipidemia: Secondary | ICD-10-CM | POA: Diagnosis not present

## 2021-05-30 DIAGNOSIS — G20 Parkinson's disease: Secondary | ICD-10-CM | POA: Diagnosis not present

## 2021-05-30 DIAGNOSIS — R258 Other abnormal involuntary movements: Secondary | ICD-10-CM | POA: Diagnosis not present

## 2021-05-30 DIAGNOSIS — G252 Other specified forms of tremor: Secondary | ICD-10-CM | POA: Diagnosis not present

## 2021-06-06 DIAGNOSIS — M1711 Unilateral primary osteoarthritis, right knee: Secondary | ICD-10-CM | POA: Diagnosis not present

## 2021-06-22 DIAGNOSIS — C029 Malignant neoplasm of tongue, unspecified: Secondary | ICD-10-CM | POA: Diagnosis not present

## 2021-06-28 NOTE — Progress Notes (Addendum)
Anesthesia Review:  PCP: DR Teryl Lucy in [place]  Cardiologist : none  Neuro- DR Maudie Mercury- [facility] with DR Curtis Sites on 05/30/21.  Chest x-ray : EKG :07/10/21  Ekg- 12/29/20 Echo : Stress test: Cardiac Cath :  Activity level: can do a flight of stair siwthout difficutly  Sleep Study/ CPAP : Fasting Blood Sugar :      / Checks Blood Sugar -- times a day:   Blood Thinner/ Instructions /Last Dose: ASA / Instructions/ Last Dose :   No covid test- ambulatory surgery

## 2021-06-29 NOTE — Progress Notes (Addendum)
DUE TO COVID-19 ONLY ONE VISITOR IS ALLOWED TO COME WITH YOU AND STAY IN THE WAITING ROOM ONLY DURING PRE OP AND PROCEDURE DAY OF SURGERY. THE 1 VISITOR  MAY VISIT WITH YOU AFTER SURGERY IN YOUR PRIVATE ROOM DURING VISITING HOURS ONLY!  YOU NEED TO HAVE A COVID 19 TEST ON_______ @_______ , THIS TEST MUST BE DONE BEFORE SURGERY,  COVID TESTING SITE IS AT [facility]. PLEASE REMAIN IN YOUR CAR THIS IS A DRIVER UP TEST. AFTER YOUR COVID TEST PLEASE WEAR A MASK OUT IN PUBLIC AND SOCIAL DISTANCE AND [place] YOUR HANDS FREQUENTLY. PLEASE ASK ALL YOUR CLOSE CONTACTS TO WEAR A MASK OUT IN PUBLIC AND SOCIAL DISTANCE AND [place] HANDS FREQUENTLY ALSO.               Abigail Flores  06/29/2021   Your procedure is scheduled on:    07/24/21   Report to [facility] Main  Entrance   Report to admitting at    [phone]     Call this number if you have problems the morning of surgery [phone]    Remember: Do not eat food , candy gum or mints :After Midnight. You may have clear liquids from midnight until __ 0430am Please complete Ensure preop drink by 0430am.    CLEAR LIQUID DIET   Foods Allowed                                                                       Coffee and tea, regular and decaf                              Plain Jell-O any favor except red or purple                                            Fruit ices (not with fruit pulp)                                      Iced Popsicles                                     Carbonated beverages, regular and diet                                    Cranberry, grape and apple juices Sports drinks like Gatorade Lightly seasoned clear broth or consume(fat free) Sugar   _____________________________________________________________________    BRUSH YOUR TEETH MORNING OF SURGERY AND RINSE YOUR MOUTH OUT, NO CHEWING GUM CANDY OR MINTS.     Take these medicines the morning of surgery with A SIP OF WATER:  Amlodipine, armour  thyroid, sinemet    DO NOT TAKE ANY DIABETIC MEDICATIONS DAY OF YOUR SURGERY  You may not have any metal on your body including hair pins and              piercings  Do not wear jewelry, make-up, lotions, powders or perfumes, deodorant             Do not wear nail polish on your fingernails.  Do not shave  48 hours prior to surgery.              Men may shave face and neck.   Do not bring valuables to the hospital. [place].  Contacts, dentures or bridgework may not be worn into surgery.  Leave suitcase in the car. After surgery it may be brought to your room.     Patients discharged the day of surgery will not be allowed to drive home. IF YOU ARE HAVING SURGERY AND GOING HOME THE SAME DAY, YOU MUST HAVE AN ADULT TO DRIVE YOU HOME AND BE WITH YOU FOR 24 HOURS. YOU MAY GO HOME BY TAXI OR UBER OR ORTHERWISE, BUT AN ADULT MUST ACCOMPANY YOU HOME AND STAY WITH YOU FOR 24 HOURS.  Name and phone number of your driver:  Special Instructions: N/A              Please read over the following fact sheets you were given: _____________________________________________________________________  [facility] - Preparing for Surgery Before surgery, you can play an important role.  Because skin is not sterile, your skin needs to be as free of germs as possible.  You can reduce the number of germs on your skin by washing with CHG (chlorahexidine gluconate) soap before surgery.  CHG is an antiseptic cleaner which kills germs and bonds with the skin to continue killing germs even after washing. Please DO NOT use if you have an allergy to CHG or antibacterial soaps.  If your skin becomes reddened/irritated stop using the CHG and inform your nurse when you arrive at Short Stay. Do not shave (including legs and underarms) for at least 48 hours prior to the first CHG shower.  You may shave your face/neck. Please follow these instructions  carefully:  1.  Shower with CHG Soap the night before surgery and the  morning of Surgery.  2.  If you choose to wash your hair, wash your hair first as usual with your  normal  shampoo.  3.  After you shampoo, rinse your hair and body thoroughly to remove the  shampoo.                           4.  Use CHG as you would any other liquid soap.  You can apply chg directly  to the skin and wash                       Gently with a scrungie or clean washcloth.  5.  Apply the CHG Soap to your body ONLY FROM THE NECK DOWN.   Do not use on face/ open                           Wound or open sores. Avoid contact with eyes, ears mouth and genitals (private parts).  Wash face,  Genitals (private parts) with your normal soap.             6.  Wash thoroughly, paying special attention to the area where your surgery  will be performed.  7.  Thoroughly rinse your body with warm water from the neck down.  8.  DO NOT shower/wash with your normal soap after using and rinsing off  the CHG Soap.                9.  Pat yourself dry with a clean towel.            10.  Wear clean pajamas.            11.  Place clean sheets on your bed the night of your first shower and do not  sleep with pets. Day of Surgery : Do not apply any lotions/deodorants the morning of surgery.  Please wear clean clothes to the hospital/surgery center.  FAILURE TO FOLLOW THESE INSTRUCTIONS MAY RESULT IN THE CANCELLATION OF YOUR SURGERY PATIENT SIGNATURE_________________________________  NURSE SIGNATURE__________________________________  ________________________________________________________________________

## 2021-07-02 DIAGNOSIS — M1711 Unilateral primary osteoarthritis, right knee: Secondary | ICD-10-CM | POA: Diagnosis present

## 2021-07-04 DIAGNOSIS — M1711 Unilateral primary osteoarthritis, right knee: Secondary | ICD-10-CM | POA: Diagnosis not present
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
## 2021-07-09 ENCOUNTER — Encounter: Payer: BC Managed Care – PPO | Admitting: Internal Medicine

## 2021-07-10 ENCOUNTER — Other Ambulatory Visit: Payer: Self-pay

## 2021-07-10 ENCOUNTER — Encounter (HOSPITAL_COMMUNITY)
Admission: RE | Admit: 2021-07-10 | Discharge: 2021-07-10 | Disposition: A | Discharge status: Home / Self Care | Payer: BC Managed Care – PPO | Source: Ambulatory Visit | Attending: Orthopedic Surgery | Admitting: Orthopedic Surgery

## 2021-07-10 ENCOUNTER — Encounter (HOSPITAL_COMMUNITY): Payer: Self-pay

## 2021-07-10 DIAGNOSIS — Z79899 Other long term (current) drug therapy: Secondary | ICD-10-CM | POA: Insufficient documentation

## 2021-07-10 DIAGNOSIS — Z87891 Personal history of nicotine dependence: Secondary | ICD-10-CM | POA: Diagnosis not present

## 2021-07-10 DIAGNOSIS — Z79890 Hormone replacement therapy (postmenopausal): Secondary | ICD-10-CM | POA: Insufficient documentation

## 2021-07-10 DIAGNOSIS — Z85810 Personal history of malignant neoplasm of tongue: Secondary | ICD-10-CM | POA: Insufficient documentation

## 2021-07-10 DIAGNOSIS — I10 Essential (primary) hypertension: Secondary | ICD-10-CM | POA: Diagnosis not present

## 2021-07-10 DIAGNOSIS — Z96652 Presence of left artificial knee joint: Secondary | ICD-10-CM | POA: Diagnosis not present

## 2021-07-10 DIAGNOSIS — E039 Hypothyroidism, unspecified: Secondary | ICD-10-CM | POA: Insufficient documentation

## 2021-07-10 DIAGNOSIS — M1711 Unilateral primary osteoarthritis, right knee: Secondary | ICD-10-CM | POA: Diagnosis not present

## 2021-07-10 DIAGNOSIS — Z01818 Encounter for other preprocedural examination: Secondary | ICD-10-CM | POA: Diagnosis not present

## 2021-07-10 LAB — BASIC METABOLIC PANEL
Anion gap: 9 (ref 5–15)
BUN: 19 mg/dL (ref 8–23)
CO2: 26 mmol/L (ref 22–32)
Calcium: 9.6 mg/dL (ref 8.9–10.3)
Chloride: 100 mmol/L (ref 98–111)
Creatinine, Ser: 0.81 mg/dL (ref 0.44–1.00)
GFR, Estimated: >60 mL/min (ref >60)
Glucose, Bld: 87 mg/dL (ref 70–99)
Potassium: 4.1 mmol/L (ref 3.5–5.1)
Sodium: 135 mmol/L (ref 135–145)

## 2021-07-10 LAB — CBC
HCT: 43.7 % (ref 36.0–46.0)
Hemoglobin: 14.6 g/dL (ref 12.0–15.0)
MCH: 28.8 pg (ref 26.0–34.0)
MCHC: 33.4 g/dL (ref 30.0–36.0)
MCV: 86.2 fL (ref 80.0–100.0)
Platelets: 365 10*3/uL (ref 150–400)
RBC: 5.07 MIL/uL (ref 3.87–5.11)
RDW: 12.5 % (ref 11.5–15.5)
WBC: 6.1 10*3/uL (ref 4.0–10.5)
nRBC: 0 % (ref 0.0–0.2)

## 2021-07-10 LAB — SURGICAL PCR SCREEN
MRSA, PCR: NEGATIVE
Staphylococcus aureus: NEGATIVE
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
## 2021-07-23 NOTE — Anesthesia Preprocedure Evaluation (Addendum)
Anesthesia Evaluation  Patient identified by MRN, date of birth, ID band Patient awake    Reviewed: Allergy & Precautions, NPO status , Patient's Chart, lab work & pertinent test results  Airway Mallampati: II  TM Distance: >3 FB Neck ROM: Full    Dental  (+) Teeth Intact   Pulmonary neg pulmonary ROS, former smoker,    Pulmonary exam normal        Cardiovascular hypertension, Pt. on medications  Rhythm:Regular Rate:Normal     Neuro/Psych negative neurological ROS  negative psych ROS   GI/Hepatic negative GI ROS, Neg liver ROS,   Endo/Other  Hypothyroidism   Renal/GU negative Renal ROS  negative genitourinary   Musculoskeletal  (+) Arthritis , Osteoarthritis,    Abdominal (+)  Abdomen: soft.    Peds  Hematology negative hematology ROS (+)   Anesthesia Other Findings   Reproductive/Obstetrics                            Anesthesia Physical Anesthesia Plan  ASA: 2  Anesthesia Plan: MAC, Regional and Spinal   Post-op Pain Management:    Induction: Intravenous  PONV Risk Score and Plan: 2 and Ondansetron, Dexamethasone, Midazolam, Propofol infusion and Treatment may vary due to age or medical condition  Airway Management Planned: Simple Face Mask, Natural Airway and Nasal Cannula  Additional Equipment: None  Intra-op Plan:   Post-operative Plan:   Informed Consent: I have reviewed the patients History and Physical, chart, labs and discussed the procedure including the risks, benefits and alternatives for the proposed anesthesia with the patient or authorized representative who has indicated his/her understanding and acceptance.     Dental advisory given  Plan Discussed with: CRNA  Anesthesia Plan Comments: (Lab Results      Component                Value               Date                      WBC                      6.1                 07/10/2021                HGB                       14.6                07/10/2021                HCT                      43.7                07/10/2021                MCV                      86.2                07/10/2021                PLT  365                 07/10/2021           Lab Results      Component                Value               Date                      NA                       135                 07/10/2021                K                        4.1                 07/10/2021                CO2                      26                  07/10/2021                GLUCOSE                  87                  07/10/2021                BUN                      19                  07/10/2021                CREATININE               0.81                07/10/2021                CALCIUM                  9.6                 07/10/2021                GFRNONAA                 >60                 07/10/2021          )       Anesthesia Quick Evaluation

## 2021-07-24 ENCOUNTER — Ambulatory Visit (HOSPITAL_COMMUNITY)
Admission: RE | Admit: 2021-07-24 | Discharge: 2021-07-25 | Disposition: A | Discharge status: Home / Self Care | Payer: BC Managed Care – PPO | Attending: Orthopedic Surgery | Admitting: Orthopedic Surgery

## 2021-07-24 ENCOUNTER — Other Ambulatory Visit: Payer: Self-pay

## 2021-07-24 ENCOUNTER — Encounter (HOSPITAL_COMMUNITY): Payer: Self-pay | Admitting: Orthopedic Surgery

## 2021-07-24 ENCOUNTER — Ambulatory Visit (HOSPITAL_COMMUNITY): Payer: BC Managed Care – PPO | Admitting: Physician Assistant

## 2021-07-24 ENCOUNTER — Ambulatory Visit (HOSPITAL_COMMUNITY): Payer: BC Managed Care – PPO

## 2021-07-24 ENCOUNTER — Encounter (HOSPITAL_COMMUNITY)
Admission: RE | Disposition: A | Discharge status: Home / Self Care | Payer: Self-pay | Source: Home / Self Care | Attending: Orthopedic Surgery

## 2021-07-24 ENCOUNTER — Ambulatory Visit (HOSPITAL_COMMUNITY): Payer: BC Managed Care – PPO | Admitting: Certified Registered"

## 2021-07-24 DIAGNOSIS — Z96652 Presence of left artificial knee joint: Secondary | ICD-10-CM | POA: Diagnosis not present

## 2021-07-24 DIAGNOSIS — M1711 Unilateral primary osteoarthritis, right knee: Secondary | ICD-10-CM | POA: Diagnosis not present

## 2021-07-24 DIAGNOSIS — Z87891 Personal history of nicotine dependence: Secondary | ICD-10-CM | POA: Insufficient documentation

## 2021-07-24 DIAGNOSIS — E7800 Pure hypercholesterolemia, unspecified: Secondary | ICD-10-CM | POA: Insufficient documentation

## 2021-07-24 DIAGNOSIS — Z881 Allergy status to other antibiotic agents status: Secondary | ICD-10-CM | POA: Diagnosis not present

## 2021-07-24 DIAGNOSIS — Z79890 Hormone replacement therapy (postmenopausal): Secondary | ICD-10-CM | POA: Diagnosis not present

## 2021-07-24 DIAGNOSIS — Z882 Allergy status to sulfonamides status: Secondary | ICD-10-CM | POA: Diagnosis not present

## 2021-07-24 DIAGNOSIS — Z96651 Presence of right artificial knee joint: Secondary | ICD-10-CM | POA: Diagnosis not present

## 2021-07-24 DIAGNOSIS — Z79899 Other long term (current) drug therapy: Secondary | ICD-10-CM | POA: Insufficient documentation

## 2021-07-24 DIAGNOSIS — E039 Hypothyroidism, unspecified: Secondary | ICD-10-CM | POA: Insufficient documentation

## 2021-07-24 DIAGNOSIS — I10 Essential (primary) hypertension: Secondary | ICD-10-CM | POA: Insufficient documentation

## 2021-07-24 DIAGNOSIS — G8918 Other acute postprocedural pain: Secondary | ICD-10-CM | POA: Diagnosis not present

## 2021-07-24 HISTORY — PX: TOTAL KNEE ARTHROPLASTY: SHX125

## 2021-07-24 SURGERY — ARTHROPLASTY, KNEE, TOTAL
Anesthesia: Monitor Anesthesia Care | Site: Knee | Laterality: Right

## 2021-07-24 MED ORDER — LACTATED RINGERS IV BOLUS: 250.0000 mL | Freq: Once | INTRAVENOUS | Status: DC

## 2021-07-24 MED ORDER — ONDANSETRON HCL 4 MG PO TABS
4.0000 mg | ORAL_TABLET | Freq: Four times a day (QID) | ORAL | Status: DC | PRN
  Administered 2021-07-25: 4 mg via ORAL
  Filled 2021-07-24: qty 1

## 2021-07-24 MED ORDER — PROPOFOL 10 MG/ML IV BOLUS
INTRAVENOUS | Status: AC
  Filled 2021-07-24: qty 20

## 2021-07-24 MED ORDER — FENTANYL CITRATE (PF) 100 MCG/2ML IJ SOLN
INTRAMUSCULAR | Status: AC
  Filled 2021-07-24: qty 2

## 2021-07-24 MED ORDER — ACETAMINOPHEN 325 MG PO TABS: 325.0000 mg | ORAL_TABLET | Freq: Four times a day (QID) | ORAL | Status: DC | PRN

## 2021-07-24 MED ORDER — ACETAMINOPHEN 500 MG PO TABS
500.0000 mg | ORAL_TABLET | Freq: Four times a day (QID) | ORAL | Status: AC
  Administered 2021-07-24 (×2): 500 mg via ORAL
  Filled 2021-07-24 (×3): qty 1

## 2021-07-24 MED ORDER — MIDAZOLAM HCL 2 MG/2ML IJ SOLN
INTRAMUSCULAR | Status: DC | PRN
  Administered 2021-07-24 (×2): 1 mg via INTRAVENOUS

## 2021-07-24 MED ORDER — AMLODIPINE BESYLATE 10 MG PO TABS
10.0000 mg | ORAL_TABLET | Freq: Every day | ORAL | Status: DC
  Administered 2021-07-25: 10 mg via ORAL
  Filled 2021-07-24: qty 1

## 2021-07-24 MED ORDER — METHOCARBAMOL 500 MG PO TABS
500.0000 mg | ORAL_TABLET | Freq: Four times a day (QID) | ORAL | Status: DC | PRN
  Administered 2021-07-24 – 2021-07-25 (×3): 500 mg via ORAL
  Filled 2021-07-24 (×3): qty 1

## 2021-07-24 MED ORDER — DEXAMETHASONE SODIUM PHOSPHATE 10 MG/ML IJ SOLN
10.0000 mg | Freq: Once | INTRAMUSCULAR | Status: AC
  Administered 2021-07-25: 10 mg via INTRAVENOUS
  Filled 2021-07-24: qty 1

## 2021-07-24 MED ORDER — CARBIDOPA-LEVODOPA 25-100 MG PO TABS
1.0000 | ORAL_TABLET | Freq: Three times a day (TID) | ORAL | Status: DC
  Administered 2021-07-24 – 2021-07-25 (×3): 1 via ORAL
  Filled 2021-07-24 (×3): qty 1

## 2021-07-24 MED ORDER — POVIDONE-IODINE 10 % EX SWAB: 2.0000 "application " | Freq: Once | CUTANEOUS | Status: DC

## 2021-07-24 MED ORDER — POTASSIUM CHLORIDE IN NACL 20-0.45 MEQ/L-% IV SOLN
INTRAVENOUS | Status: DC
  Filled 2021-07-24 (×2): qty 1000

## 2021-07-24 MED ORDER — HYDROCODONE-ACETAMINOPHEN 10-325 MG PO TABS: 1.0000 | ORAL_TABLET | Freq: Four times a day (QID) | ORAL | 0 refills | Status: AC | PRN

## 2021-07-24 MED ORDER — ONDANSETRON HCL 4 MG/2ML IJ SOLN
4.0000 mg | Freq: Four times a day (QID) | INTRAMUSCULAR | Status: DC | PRN
  Administered 2021-07-24 – 2021-07-25 (×3): 4 mg via INTRAVENOUS
  Filled 2021-07-24 (×3): qty 2

## 2021-07-24 MED ORDER — DEXAMETHASONE SODIUM PHOSPHATE 10 MG/ML IJ SOLN
INTRAMUSCULAR | Status: DC | PRN
  Administered 2021-07-24: 8 mg via INTRAVENOUS

## 2021-07-24 MED ORDER — FENTANYL CITRATE PF 50 MCG/ML IJ SOSY
PREFILLED_SYRINGE | INTRAMUSCULAR | Status: AC
  Filled 2021-07-24: qty 2

## 2021-07-24 MED ORDER — ALUM & MAG HYDROXIDE-SIMETH 200-200-20 MG/5ML PO SUSP: 30.0000 mL | ORAL | Status: DC | PRN

## 2021-07-24 MED ORDER — MENTHOL 3 MG MT LOZG: 1.0000 | LOZENGE | OROMUCOSAL | Status: DC | PRN

## 2021-07-24 MED ORDER — ASPIRIN EC 325 MG PO TBEC: 325.0000 mg | DELAYED_RELEASE_TABLET | Freq: Two times a day (BID) | ORAL | 0 refills | Status: DC

## 2021-07-24 MED ORDER — DEXMEDETOMIDINE (PRECEDEX) IN NS 20 MCG/5ML (4 MCG/ML) IV SYRINGE
PREFILLED_SYRINGE | INTRAVENOUS | Status: DC | PRN
  Administered 2021-07-24: 4 ug via INTRAVENOUS
  Administered 2021-07-24: 8 ug via INTRAVENOUS
  Administered 2021-07-24: 4 ug via INTRAVENOUS

## 2021-07-24 MED ORDER — FLEET ENEMA 7-19 GM/118ML RE ENEM: 1.0000 | ENEMA | Freq: Once | RECTAL | Status: DC | PRN

## 2021-07-24 MED ORDER — ONDANSETRON HCL 4 MG/2ML IJ SOLN
INTRAMUSCULAR | Status: AC
  Filled 2021-07-24: qty 2

## 2021-07-24 MED ORDER — EPHEDRINE SULFATE-NACL 50-0.9 MG/10ML-% IV SOSY
PREFILLED_SYRINGE | INTRAVENOUS | Status: DC | PRN
  Administered 2021-07-24: 10 mg via INTRAVENOUS

## 2021-07-24 MED ORDER — LACTATED RINGERS IV BOLUS
500.0000 mL | Freq: Once | INTRAVENOUS | Status: AC
  Administered 2021-07-24: 500 mL via INTRAVENOUS

## 2021-07-24 MED ORDER — TRANEXAMIC ACID-NACL 1000-0.7 MG/100ML-% IV SOLN: 1000.0000 mg | Freq: Once | INTRAVENOUS | Status: DC

## 2021-07-24 MED ORDER — METHOCARBAMOL 500 MG IVPB - SIMPLE MED
500.0000 mg | Freq: Four times a day (QID) | INTRAVENOUS | Status: DC | PRN
  Filled 2021-07-24: qty 50

## 2021-07-24 MED ORDER — PHENOL 1.4 % MT LIQD: 1.0000 | OROMUCOSAL | Status: DC | PRN

## 2021-07-24 MED ORDER — CLONIDINE HCL (ANALGESIA) 100 MCG/ML EP SOLN
EPIDURAL | Status: DC | PRN
  Administered 2021-07-24: 100 ug

## 2021-07-24 MED ORDER — DIPHENHYDRAMINE HCL 12.5 MG/5ML PO ELIX: 12.5000 mg | ORAL_SOLUTION | ORAL | Status: DC | PRN

## 2021-07-24 MED ORDER — ASPIRIN EC 325 MG PO TBEC
325.0000 mg | DELAYED_RELEASE_TABLET | Freq: Every day | ORAL | Status: DC
  Administered 2021-07-25: 325 mg via ORAL
  Filled 2021-07-24: qty 1

## 2021-07-24 MED ORDER — ACETAMINOPHEN 500 MG PO TABS
1000.0000 mg | ORAL_TABLET | Freq: Once | ORAL | Status: AC
  Administered 2021-07-24: 1000 mg via ORAL
  Filled 2021-07-24: qty 2

## 2021-07-24 MED ORDER — BUPIVACAINE HCL 0.25 % IJ SOLN
INTRAMUSCULAR | Status: DC | PRN
  Administered 2021-07-24: 30 mL

## 2021-07-24 MED ORDER — MIDAZOLAM HCL 2 MG/2ML IJ SOLN
INTRAMUSCULAR | Status: AC
  Filled 2021-07-24: qty 2

## 2021-07-24 MED ORDER — FENTANYL CITRATE PF 50 MCG/ML IJ SOSY
25.0000 ug | PREFILLED_SYRINGE | INTRAMUSCULAR | Status: DC | PRN
  Administered 2021-07-24: 25 ug via INTRAVENOUS
  Administered 2021-07-24: 50 ug via INTRAVENOUS
  Administered 2021-07-24: 25 ug via INTRAVENOUS

## 2021-07-24 MED ORDER — BISACODYL 10 MG RE SUPP: 10.0000 mg | Freq: Every day | RECTAL | Status: DC | PRN

## 2021-07-24 MED ORDER — CEFAZOLIN SODIUM-DEXTROSE 2-4 GM/100ML-% IV SOLN
2.0000 g | Freq: Four times a day (QID) | INTRAVENOUS | Status: AC
Start: 2021-07-24 — End: 2021-07-24
  Administered 2021-07-24 (×2): 2 g via INTRAVENOUS
  Filled 2021-07-24 (×2): qty 100

## 2021-07-24 MED ORDER — DEXAMETHASONE SODIUM PHOSPHATE 10 MG/ML IJ SOLN
INTRAMUSCULAR | Status: AC
  Filled 2021-07-24: qty 1

## 2021-07-24 MED ORDER — BUPIVACAINE IN DEXTROSE 0.75-8.25 % IT SOLN
INTRATHECAL | Status: DC | PRN
  Administered 2021-07-24: 1.8 mL via INTRATHECAL

## 2021-07-24 MED ORDER — PROPOFOL 10 MG/ML IV BOLUS
INTRAVENOUS | Status: DC | PRN
  Administered 2021-07-24: 10 mg via INTRAVENOUS
  Administered 2021-07-24: 90 mg via INTRAVENOUS
  Administered 2021-07-24: 30 mg via INTRAVENOUS
  Administered 2021-07-24: 20 mg via INTRAVENOUS

## 2021-07-24 MED ORDER — POVIDONE-IODINE 10 % EX SWAB
2.0000 "application " | Freq: Once | CUTANEOUS | Status: AC
  Administered 2021-07-24: 2 via TOPICAL

## 2021-07-24 MED ORDER — ORAL CARE MOUTH RINSE: 15.0000 mL | Freq: Once | OROMUCOSAL | Status: AC

## 2021-07-24 MED ORDER — POVIDONE-IODINE 7.5 % EX SOLN: Freq: Once | CUTANEOUS | Status: DC

## 2021-07-24 MED ORDER — PROPOFOL 500 MG/50ML IV EMUL
INTRAVENOUS | Status: DC | PRN
  Administered 2021-07-24: 40 ug/kg/min via INTRAVENOUS

## 2021-07-24 MED ORDER — TRANEXAMIC ACID-NACL 1000-0.7 MG/100ML-% IV SOLN
1000.0000 mg | INTRAVENOUS | Status: AC
  Administered 2021-07-24: 1000 mg via INTRAVENOUS
  Filled 2021-07-24: qty 100

## 2021-07-24 MED ORDER — SODIUM CHLORIDE 0.9 % IR SOLN
Status: DC | PRN
  Administered 2021-07-24: 3000 mL

## 2021-07-24 MED ORDER — HYDROCODONE-ACETAMINOPHEN 5-325 MG PO TABS
1.0000 | ORAL_TABLET | ORAL | Status: DC | PRN
  Administered 2021-07-24: 2 via ORAL
  Filled 2021-07-24: qty 2

## 2021-07-24 MED ORDER — POLYETHYLENE GLYCOL 3350 17 G PO PACK: 17.0000 g | PACK | Freq: Every day | ORAL | Status: DC | PRN

## 2021-07-24 MED ORDER — METOCLOPRAMIDE HCL 5 MG/ML IJ SOLN
5.0000 mg | Freq: Three times a day (TID) | INTRAMUSCULAR | Status: DC | PRN
  Administered 2021-07-24: 10 mg via INTRAVENOUS
  Filled 2021-07-24: qty 2

## 2021-07-24 MED ORDER — THYROID 30 MG PO TABS
15.0000 mg | ORAL_TABLET | Freq: Every day | ORAL | Status: DC
  Filled 2021-07-24: qty 1

## 2021-07-24 MED ORDER — MORPHINE SULFATE (PF) 2 MG/ML IV SOLN
0.5000 mg | INTRAVENOUS | Status: DC | PRN
  Administered 2021-07-24: 1 mg via INTRAVENOUS
  Filled 2021-07-24: qty 1

## 2021-07-24 MED ORDER — ROPIVACAINE HCL 5 MG/ML IJ SOLN
INTRAMUSCULAR | Status: DC | PRN
  Administered 2021-07-24: 20 mL via PERINEURAL

## 2021-07-24 MED ORDER — KETOROLAC TROMETHAMINE 30 MG/ML IJ SOLN
INTRAMUSCULAR | Status: DC | PRN
  Administered 2021-07-24: 30 mg

## 2021-07-24 MED ORDER — CHLORHEXIDINE GLUCONATE 0.12 % MT SOLN
15.0000 mL | Freq: Once | OROMUCOSAL | Status: AC
  Administered 2021-07-24: 15 mL via OROMUCOSAL

## 2021-07-24 MED ORDER — LACTATED RINGERS IV SOLN: INTRAVENOUS | Status: DC

## 2021-07-24 MED ORDER — DOCUSATE SODIUM 100 MG PO CAPS
100.0000 mg | ORAL_CAPSULE | Freq: Two times a day (BID) | ORAL | Status: DC
  Administered 2021-07-24 – 2021-07-25 (×2): 100 mg via ORAL
  Filled 2021-07-24 (×2): qty 1

## 2021-07-24 MED ORDER — CEFAZOLIN SODIUM-DEXTROSE 2-4 GM/100ML-% IV SOLN
2.0000 g | INTRAVENOUS | Status: AC
  Administered 2021-07-24: 2 g via INTRAVENOUS
  Filled 2021-07-24: qty 100

## 2021-07-24 MED ORDER — HYDROCHLOROTHIAZIDE 12.5 MG PO TABS
12.5000 mg | ORAL_TABLET | Freq: Every day | ORAL | Status: DC
  Administered 2021-07-25: 12.5 mg via ORAL
  Filled 2021-07-24: qty 1

## 2021-07-24 MED ORDER — FENTANYL CITRATE (PF) 100 MCG/2ML IJ SOLN
INTRAMUSCULAR | Status: DC | PRN
  Administered 2021-07-24: 50 ug via INTRAVENOUS
  Administered 2021-07-24 (×6): 25 ug via INTRAVENOUS
  Administered 2021-07-24: 50 ug via INTRAVENOUS
  Administered 2021-07-24 (×2): 25 ug via INTRAVENOUS

## 2021-07-24 MED ORDER — METOCLOPRAMIDE HCL 5 MG PO TABS
5.0000 mg | ORAL_TABLET | Freq: Three times a day (TID) | ORAL | Status: DC | PRN
  Administered 2021-07-25: 10 mg via ORAL
  Filled 2021-07-24: qty 2

## 2021-07-24 MED ORDER — ONDANSETRON HCL 4 MG PO TABS: 4.0000 mg | ORAL_TABLET | Freq: Three times a day (TID) | ORAL | 0 refills | Status: DC | PRN

## 2021-07-24 MED ORDER — ONDANSETRON HCL 4 MG/2ML IJ SOLN
INTRAMUSCULAR | Status: DC | PRN
  Administered 2021-07-24: 4 mg via INTRAVENOUS

## 2021-07-24 MED ORDER — SENNA-DOCUSATE SODIUM 8.6-50 MG PO TABS: 2.0000 | ORAL_TABLET | Freq: Every day | ORAL | 1 refills | Status: DC

## 2021-07-24 MED ORDER — HYDROCODONE-ACETAMINOPHEN 7.5-325 MG PO TABS
1.0000 | ORAL_TABLET | ORAL | Status: DC | PRN
  Administered 2021-07-24 – 2021-07-25 (×5): 2 via ORAL
  Filled 2021-07-24 (×5): qty 2

## 2021-07-24 MED ORDER — ZOLPIDEM TARTRATE 5 MG PO TABS: 5.0000 mg | ORAL_TABLET | Freq: Every evening | ORAL | Status: DC | PRN

## 2021-07-24 MED ORDER — PHENYLEPHRINE HCL-NACL 20-0.9 MG/250ML-% IV SOLN
INTRAVENOUS | Status: DC | PRN
  Administered 2021-07-24: 20 ug/min via INTRAVENOUS

## 2021-07-24 SURGICAL SUPPLY — 55 items
ATTUNE MED DOME PAT 38 KNEE (Knees) ×1 IMPLANT
ATTUNE PS FEM RT SZ 5 CEM KNEE (Femur) ×1 IMPLANT
BAG COUNTER SPONGE SURGICOUNT (BAG) ×1 IMPLANT
BAG SPNG CNTER NS LX DISP (BAG) ×1
BASEPLATE TIB CMT FB PCKT SZ6 (Knees) ×1 IMPLANT
BLADE SAG 18X100X1.27 (BLADE) ×2 IMPLANT
BLADE SAW SGTL 11.0X1.19X90.0M (BLADE) ×2 IMPLANT
BLADE SAW SGTL 13X75X1.27 (BLADE) ×2 IMPLANT
BLADE SURG 15 STRL LF DISP TIS (BLADE) ×1 IMPLANT
BLADE SURG 15 STRL SS (BLADE) ×2
BNDG CMPR MED 10X6 ELC LF (GAUZE/BANDAGES/DRESSINGS) ×1
BNDG ELASTIC 6X10 VLCR STRL LF (GAUZE/BANDAGES/DRESSINGS) ×2 IMPLANT
BOWL SMART MIX CTS (DISPOSABLE) ×2 IMPLANT
BSPLAT TIB 6 CMNT FXBRNG STRL (Knees) ×1 IMPLANT
CEMENT HV SMART SET (Cement) ×4 IMPLANT
CLSR STERI-STRIP ANTIMIC 1/2X4 (GAUZE/BANDAGES/DRESSINGS) ×4 IMPLANT
COVER SURGICAL LIGHT HANDLE (MISCELLANEOUS) ×2 IMPLANT
CUFF TOURN SGL QUICK 34 (TOURNIQUET CUFF) ×2
CUFF TRNQT CYL 34X4.125X (TOURNIQUET CUFF) ×1 IMPLANT
DRAPE SHEET LG 3/4 BI-LAMINATE (DRAPES) ×2 IMPLANT
DRAPE U-SHAPE 47X51 STRL (DRAPES) ×2 IMPLANT
DRSG AQUACEL AG ADV 3.5X10 (GAUZE/BANDAGES/DRESSINGS) ×1 IMPLANT
DRSG MEPILEX BORDER 4X12 (GAUZE/BANDAGES/DRESSINGS) ×2 IMPLANT
DRSG PAD ABDOMINAL 8X10 ST (GAUZE/BANDAGES/DRESSINGS) ×3 IMPLANT
DURAPREP 26ML APPLICATOR (WOUND CARE) ×4 IMPLANT
ELECT REM PT RETURN 15FT ADLT (MISCELLANEOUS) ×2 IMPLANT
GLOVE SRG 8 PF TXTR STRL LF DI (GLOVE) ×1 IMPLANT
GLOVE SURG ENC MOIS LTX SZ6.5 (GLOVE) ×2 IMPLANT
GLOVE SURG ENC MOIS LTX SZ7.5 (GLOVE) ×2 IMPLANT
GLOVE SURG UNDER POLY LF SZ7 (GLOVE) ×2 IMPLANT
GLOVE SURG UNDER POLY LF SZ8 (GLOVE) ×2
GOWN STRL REUS W/ TWL LRG LVL3 (GOWN DISPOSABLE) ×2 IMPLANT
GOWN STRL REUS W/TWL LRG LVL3 (GOWN DISPOSABLE) ×4
HANDPIECE INTERPULSE COAX TIP (DISPOSABLE) ×2
HOLDER FOLEY CATH W/STRAP (MISCELLANEOUS) ×1 IMPLANT
HOOD PEEL AWAY FLYTE STAYCOOL (MISCELLANEOUS) ×6 IMPLANT
IMMOBILIZER KNEE 20 (SOFTGOODS) ×2
IMMOBILIZER KNEE 20 THIGH 36 (SOFTGOODS) ×1 IMPLANT
INSERT TIB FIX BEARNG SZ 5 5MM (Insert) ×1 IMPLANT
KIT TURNOVER KIT A (KITS) ×1 IMPLANT
MANIFOLD NEPTUNE II (INSTRUMENTS) ×2 IMPLANT
NS IRRIG 1000ML POUR BTL (IV SOLUTION) ×2 IMPLANT
PACK TOTAL KNEE CUSTOM (KITS) ×2 IMPLANT
PROTECTOR NERVE ULNAR (MISCELLANEOUS) ×2 IMPLANT
SET HNDPC FAN SPRY TIP SCT (DISPOSABLE) ×1 IMPLANT
SET PAD KNEE POSITIONER (MISCELLANEOUS) ×2 IMPLANT
STRIP CLOSURE SKIN 1/2X4 (GAUZE/BANDAGES/DRESSINGS) ×1 IMPLANT
SUT VIC AB 1 CT1 36 (SUTURE) ×4 IMPLANT
SUT VIC AB 2-0 CT1 27 (SUTURE) ×2
SUT VIC AB 2-0 CT1 TAPERPNT 27 (SUTURE) ×1 IMPLANT
SUT VIC AB 3-0 SH 8-18 (SUTURE) ×2 IMPLANT
TRAY FOLEY MTR SLVR 16FR STAT (SET/KITS/TRAYS/PACK) ×2 IMPLANT
TUBE SUCTION HIGH CAP CLEAR NV (SUCTIONS) ×2 IMPLANT
WATER STERILE IRR 1000ML POUR (IV SOLUTION) ×4 IMPLANT
WRAP KNEE MAXI GEL POST OP (GAUZE/BANDAGES/DRESSINGS) ×2 IMPLANT

## 2021-07-24 NOTE — Anesthesia Postprocedure Evaluation (Signed)
Anesthesia Post Note  Patient: Abigail Flores  Procedure(s) Performed: RIGHT TOTAL KNEE ARTHROPLASTY (Right: Knee)     Patient location during evaluation: PACU Anesthesia Type: Regional, MAC, Spinal and General Level of consciousness: awake and alert Pain management: pain level controlled Vital Signs Assessment: post-procedure vital signs reviewed and stable Respiratory status: spontaneous breathing, nonlabored ventilation, respiratory function stable and patient connected to nasal cannula oxygen Cardiovascular status: blood pressure returned to baseline and stable Postop Assessment: no apparent nausea or vomiting Anesthetic complications: yes Comments: Patient with reported pain and movement at time of incision. Decision made to convert to GA    No notable events documented.  Last Vitals:  Vitals:   07/24/21 1300 07/24/21 1325  BP: 123/66 127/84  Pulse: 83 87  Resp: 10 16  Temp:    SpO2: 98% 100%    Last Pain:  Vitals:   07/24/21 1406  TempSrc:   PainSc: 7                  Leiyah Maultsby P Ioannis Schuh

## 2021-07-24 NOTE — Discharge Instructions (Addendum)

## 2021-07-24 NOTE — Anesthesia Procedure Notes (Addendum)
Procedure Name: LMA Insertion Date/Time: 07/24/2021 8:08 AM Performed by: Eben Burow, CRNA Pre-anesthesia Checklist: Patient identified, Emergency Drugs available, Suction available, Patient being monitored and Timeout performed Patient Re-evaluated:Patient Re-evaluated prior to induction Oxygen Delivery Method: Circle system utilized Preoxygenation: Pre-oxygenation with 100% oxygen Induction Type: IV induction Ventilation: Mask ventilation without difficulty LMA: LMA inserted LMA Size: 4.0 Number of attempts: 1 Tube secured with: Tape Dental Injury: Teeth and Oropharynx as per pre-operative assessment  Comments: LMA placed by Dr Gloris Manchester x 1 attempt.
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
## 2021-07-24 NOTE — Interval H&P Note (Signed)
History and Physical Interval Note:  07/24/2021 7:17 AM  Abigail Flores  has presented today for surgery, with the diagnosis of OA RIGHT KNEE.  The various methods of treatment have been discussed with the patient and family. After consideration of risks, benefits and other options for treatment, the patient has consented to  Procedure(s): TOTAL KNEE ARTHROPLASTY (Right) as a surgical intervention.  The patient's history has been reviewed, patient examined, no change in status, stable for surgery.  I have reviewed the patient's chart and labs.  Questions were answered to the patient's satisfaction.     [name]

## 2021-07-24 NOTE — Anesthesia Procedure Notes (Addendum)
Spinal  Patient location during procedure: OR Start time: 07/24/2021 7:33 AM Reason for block: surgical anesthesia Staffing Performed: resident/CRNA  Anesthesiologist: Darral Dash, DO Resident/CRNA: Eben Burow, CRNA Preanesthetic Checklist Completed: patient identified, IV checked, site marked, risks and benefits discussed, surgical consent, monitors and equipment checked, pre-op evaluation and timeout performed Spinal Block Patient position: sitting Prep: DuraPrep and site prepped and draped Patient monitoring: heart rate, continuous pulse ox and blood pressure Approach: midline Location: L3-4 Injection technique: single-shot Needle Needle type: Pencan  Needle gauge: 24 G Needle length: 10 cm Assessment Sensory level: T4 Events: CSF return Additional Notes Pt placed in sitting position, spinal kit expiration date checked and verified, + CSF, - heme, pt tolerated well. Dr Gloris Manchester present and supervising throughout [place].

## 2021-07-24 NOTE — Anesthesia Procedure Notes (Signed)
Anesthesia Regional Block: Adductor canal block   Pre-Anesthetic Checklist: , timeout performed,  Correct Patient, Correct Site, Correct Laterality,  Correct Procedure, Correct Position, site marked,  Risks and benefits discussed,  Surgical consent,  Pre-op evaluation,  At surgeon's request and post-op pain management  Laterality: Right  Prep: Dura Prep       Needles:  Injection technique: Single-shot  Needle Type: Echogenic Stimulator Needle     Needle Length: 10cm  Needle Gauge: 20     Additional Needles:   Procedures:,,,, ultrasound used (permanent image in chart),,    Narrative:  Start time: 07/24/2021 6:50 AM End time: 07/24/2021 6:54 AM Injection made incrementally with aspirations every 5 mL.  Performed by: Personally  Anesthesiologist: Darral Dash, DO  Additional Notes: Patient identified. Risks/Benefits/Options discussed with patient including but not limited to bleeding, infection, nerve damage, failed block, incomplete pain control. Patient expressed understanding and wished to proceed. All questions were answered. Sterile technique was used throughout the entire procedure. Please see nursing notes for vital signs. Aspirated in 5cc intervals with injection for negative confirmation. Patient was given instructions on fall risk and not to get out of bed. All questions and concerns addressed with instructions to call with any issues or inadequate analgesia.

## 2021-07-24 NOTE — Anesthesia Procedure Notes (Signed)
Procedure Name: MAC Date/Time: 07/24/2021 7:29 AM Performed by: Eben Burow, CRNA Pre-anesthesia Checklist: Patient identified, Emergency Drugs available, Suction available, Patient being monitored and Timeout performed Oxygen Delivery Method: Simple face mask Placement Confirmation: positive ETCO2

## 2021-07-24 NOTE — Transfer of Care (Signed)
Immediate Anesthesia Transfer of Care Note  Patient: Abigail Flores  Procedure(s) Performed: RIGHT TOTAL KNEE ARTHROPLASTY (Right: Knee)  Patient Location: PACU  Anesthesia Type:General  Level of Consciousness: awake  Airway & Oxygen Therapy: Patient Spontanous Breathing and Patient connected to face mask oxygen  Post-op Assessment: Report given to RN and Post -op Vital signs reviewed and stable  Post vital signs: Reviewed and stable  Last Vitals:  Vitals Value Taken Time  BP 133/85 07/24/21 1015  Temp    Pulse 102 07/24/21 1020  Resp 18 07/24/21 1020  SpO2 97 % 07/24/21 1020  Vitals shown include unvalidated device data.  Last Pain:  Vitals:   07/24/21 3086  TempSrc: Oral  PainSc:          Complications: No notable events documented.

## 2021-07-24 NOTE — Plan of Care (Signed)
  Problem: Education: Goal: Knowledge of General Education information will improve Description: Including pain rating scale, medication(s)/side effects and non-pharmacologic comfort measures Outcome: Progressing   Problem: Clinical Measurements: Goal: Ability to maintain clinical measurements within normal limits will improve Outcome: Progressing Goal: Will remain free from infection Outcome: Progressing Goal: Cardiovascular complication will be avoided Outcome: Progressing   Problem: Activity: Goal: Risk for activity intolerance will decrease Outcome: Progressing   Problem: Coping: Goal: Level of anxiety will decrease Outcome: Progressing   Problem: Elimination: Goal: Will not experience complications related to bowel motility Outcome: Progressing   Problem: Pain Managment: Goal: General experience of comfort will improve Outcome: Progressing   Problem: Safety: Goal: Ability to remain free from injury will improve Outcome: Progressing   Problem: Education: Goal: Knowledge of the prescribed therapeutic regimen will improve Outcome: Progressing   Problem: Activity: Goal: Ability to avoid complications of mobility impairment will improve Outcome: Progressing   Problem: Clinical Measurements: Goal: Postoperative complications will be avoided or minimized Outcome: Progressing

## 2021-07-24 NOTE — Op Note (Signed)
DATE OF SURGERY:  07/24/2021 TIME: 9:26 AM  PATIENT NAME:  Abigail Flores   AGE: 60 y.o.    PRE-OPERATIVE DIAGNOSIS: Right knee primary localized osteoarthritis  POST-OPERATIVE DIAGNOSIS:  Same  PROCEDURE: RIGHT total Knee Arthroplasty  SURGEON:  [name], MD   ASSISTANT:  Merlene Pulling, PA-C, present and scrubbed throughout the case, critical for assistance with exposure, retraction, instrumentation, and closure.   OPERATIVE IMPLANTS: Depuy Attune size 5 posterior Stabilized Femur, with a size 6 fixed Bearing Tibia, 5 polyethylene insert with a 38 medialized oval dome polyethylene patella.  PREOPERATIVE INDICATIONS:  Abigail Flores is a 60 y.o. year old female with end stage bone on bone degenerative arthritis of the knee who failed conservative treatment, including injections, antiinflammatories, activity modification, and assistive devices, and had significant impairment of their activities of daily living, and elected for Total Knee Arthroplasty.   The risks, benefits, and alternatives were discussed at length including but not limited to the risks of infection, bleeding, nerve injury, stiffness, blood clots, the need for revision surgery, cardiopulmonary complications, among others, and they were willing to proceed.  OPERATIVE FINDINGS AND UNIQUE ASPECTS OF THE CASE: She did have a slight valgus configuration, the worst of the disease on the was on the posterior lateral femoral condyle, the medial and patellofemoral compartments were not bad.  She did have diffuse synovitis, although it was not as brown as the contralateral side.  I cut the femur twice, and debated cutting the femur on 3 degrees of external rotation versus 5 degrees.  Ultimately the 3 degrees demonstrated appropriate rotational alignment with [place] line, and the patella tracked with a no thumb technique at the completion of the case.  I was between a size 5 and a size 6 on the tibia, but the 6 demonstrated  better congruity and fit throughout the circumference.  ESTIMATED BLOOD LOSS: 100 mL  OPERATIVE DESCRIPTION:  The patient was brought to the operative room and placed in a supine position.  Spinal anesthesia was administered although she was converted to general anesthetic after having demonstrated pain upon incision.  IV antibiotics were given.  The lower extremity was prepped and draped in the usual sterile fashion.  Time out was performed.  The leg was elevated and exsanguinated and the tourniquet was inflated.  Anterior quadriceps tendon splitting approach was performed.  The patella was everted and osteophytes were removed.  The anterior horn of the medial and lateral meniscus was removed.   The patella was then measured, and cut with the saw.  The thickness before the cut was 23 and after the cut was 14.  A metal shield was used to protect the patella throughout the case.    The distal femur was opened with the drill and the intramedullary distal femoral cutting jig was utilized, set at 5 degrees resecting 9 mm off the distal femur.  Care was taken to protect the collateral ligaments.  Then the extramedullary tibial cutting jig was utilized making the appropriate cut using the anterior tibial crest as a reference building in appropriate posterior slope.  Care was taken during the cut to protect the medial and collateral ligaments.  The proximal tibia was removed along with the posterior horns of the menisci.  The PCL was sacrificed.    The extensor gap was measured and found to have adequate resection, measuring to a size 5 after I removed another 2 mm off of the femur.    The distal femoral sizing  jig was applied, taking care to avoid notching.  This was set at 3 degrees of external rotation.  Then the 4-in-1 cutting jig was applied and the anterior and posterior femur was cut, along with the chamfer cuts.  All posterior osteophytes were removed.  The flexion gap was then measured and was  symmetric with the extension gap.  I completed the distal femoral preparation using the appropriate jig to prepare the box.  The proximal tibia sized and prepared accordingly with the reamer and the punch, and then all components were trialed with the poly insert.  The knee was found to have excellent balance and full motion.    The above named components were then cemented into place and all excess cement was removed.  The real polyethylene implant was placed.  After the cement had cured I released the tourniquet and confirmed excellent hemostasis with no major posterior vessel injury.    The knee was easily taken through a range of motion and the patella tracked well and the knee irrigated copiously and the parapatellar and subcutaneous tissue closed with vicryl, and monocryl with steri strips for the skin.  The wounds were injected with marcaine, and dressed with sterile gauze and the patient was awakened and returned to the PACU in stable and satisfactory condition.  There were no complications.  Total tourniquet time was 70 minutes.

## 2021-07-25 DIAGNOSIS — Z882 Allergy status to sulfonamides status: Secondary | ICD-10-CM | POA: Diagnosis not present

## 2021-07-25 DIAGNOSIS — M1711 Unilateral primary osteoarthritis, right knee: Secondary | ICD-10-CM | POA: Diagnosis not present

## 2021-07-25 DIAGNOSIS — Z881 Allergy status to other antibiotic agents status: Secondary | ICD-10-CM | POA: Diagnosis not present

## 2021-07-25 DIAGNOSIS — Z79899 Other long term (current) drug therapy: Secondary | ICD-10-CM | POA: Diagnosis not present

## 2021-07-25 DIAGNOSIS — E039 Hypothyroidism, unspecified: Secondary | ICD-10-CM | POA: Diagnosis not present

## 2021-07-25 DIAGNOSIS — Z96651 Presence of right artificial knee joint: Secondary | ICD-10-CM | POA: Diagnosis not present

## 2021-07-25 DIAGNOSIS — Z79890 Hormone replacement therapy (postmenopausal): Secondary | ICD-10-CM | POA: Diagnosis not present

## 2021-07-25 DIAGNOSIS — E7800 Pure hypercholesterolemia, unspecified: Secondary | ICD-10-CM | POA: Diagnosis not present

## 2021-07-25 DIAGNOSIS — I10 Essential (primary) hypertension: Secondary | ICD-10-CM | POA: Diagnosis not present

## 2021-07-25 DIAGNOSIS — Z96652 Presence of left artificial knee joint: Secondary | ICD-10-CM | POA: Diagnosis not present

## 2021-07-25 DIAGNOSIS — Z87891 Personal history of nicotine dependence: Secondary | ICD-10-CM | POA: Diagnosis not present

## 2021-07-25 LAB — BASIC METABOLIC PANEL
Anion gap: 6 (ref 5–15)
Anion gap: 9 (ref 5–15)
BUN: 10 mg/dL (ref 8–23)
BUN: 12 mg/dL (ref 8–23)
CO2: 25 mmol/L (ref 22–32)
CO2: 26 mmol/L (ref 22–32)
Calcium: 8.5 mg/dL — ABNORMAL LOW (ref 8.9–10.3)
Calcium: 9.3 mg/dL (ref 8.9–10.3)
Chloride: 96 mmol/L — ABNORMAL LOW (ref 98–111)
Chloride: 92 mmol/L — ABNORMAL LOW (ref 98–111)
Creatinine, Ser: 0.48 mg/dL (ref 0.44–1.00)
Creatinine, Ser: 0.75 mg/dL (ref 0.44–1.00)
GFR, Estimated: >60 mL/min (ref >60)
GFR, Estimated: >60 mL/min (ref >60)
Glucose, Bld: 110 mg/dL — ABNORMAL HIGH (ref 70–99)
Glucose, Bld: 156 mg/dL — ABNORMAL HIGH (ref 70–99)
Potassium: 4.8 mmol/L (ref 3.5–5.1)
Potassium: 4.6 mmol/L (ref 3.5–5.1)
Sodium: 127 mmol/L — ABNORMAL LOW (ref 135–145)
Sodium: 127 mmol/L — ABNORMAL LOW (ref 135–145)

## 2021-07-25 LAB — CBC
HCT: 33.8 % — ABNORMAL LOW (ref 36.0–46.0)
Hemoglobin: 11.3 g/dL — ABNORMAL LOW (ref 12.0–15.0)
MCH: 28.9 pg (ref 26.0–34.0)
MCHC: 33.4 g/dL (ref 30.0–36.0)
MCV: 86.4 fL (ref 80.0–100.0)
Platelets: 267 10*3/uL (ref 150–400)
RBC: 3.91 MIL/uL (ref 3.87–5.11)
RDW: 12.4 % (ref 11.5–15.5)
WBC: 10.9 10*3/uL — ABNORMAL HIGH (ref 4.0–10.5)
nRBC: 0 % (ref 0.0–0.2)

## 2021-07-25 MED ORDER — SODIUM CHLORIDE 0.9 % IV BOLUS
500.0000 mL | Freq: Once | INTRAVENOUS | Status: AC
  Administered 2021-07-25: 350 mL via INTRAVENOUS

## 2021-07-25 MED ORDER — SODIUM CHLORIDE 0.9 % IV SOLN: INTRAVENOUS | Status: DC
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
## 2021-07-25 NOTE — Progress Notes (Signed)
RN reviewed discharge instructions with patient and family. All questions answered.   Paperwork given. Prescriptions electronically sent to patient pharmacy.    NT rolled patient down with all belongings to family car.     Anadalay Macdonell, RN  
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
## 2021-07-25 NOTE — Progress Notes (Addendum)
     Subjective: 1 Day Post-Op s/p Procedure(s): RIGHT TOTAL KNEE ARTHROPLASTY   Patient is alert, oriented. Patient reports pain as moderate. Denies chest pain, SOB, Calf pain. She had some nausea and vomiting overnight but none yet this morning. Denies fever, chills, dizziness, lethargy. No other complaints.   Objective:  PE: VITALS:   Vitals:   07/24/21 1928 07/24/21 2310 07/25/21 0148 07/25/21 0434  BP: (!) 154/90 (!) 162/89 (!) 144/84 (!) 164/86  Pulse: 96 (!) 101 93 (!) 101  Resp: 15 16 18 18   Temp: 98 F (36.7 C) 98.6 F (37 C) 98.4 F (36.9 C) 98.2 F (36.8 C)  TempSrc: Oral Oral Oral Oral  SpO2: 92% 92% 95% 95%  Weight:      Height:        ABD soft Sensation intact distally Intact pulses distally Dorsiflexion/Plantar flexion intact Incision: scant drainage  LABS  Results for orders placed or performed during the hospital encounter of 07/24/21 (from the past 24 hour(s))  CBC     Status: Abnormal   Collection Time: 07/25/21  3:10 AM  Result Value Ref Range   WBC 10.9 (H) 4.0 - 10.5 K/uL   RBC 3.91 3.87 - 5.11 MIL/uL   Hemoglobin 11.3 (L) 12.0 - 15.0 g/dL   HCT 33.8 (L) 36.0 - 46.0 %   MCV 86.4 80.0 - 100.0 fL   MCH 28.9 26.0 - 34.0 pg   MCHC 33.4 30.0 - 36.0 g/dL   RDW 12.4 11.5 - 15.5 %   Platelets 267 150 - 400 K/uL   nRBC 0.0 0.0 - 0.2 %  Basic metabolic panel     Status: Abnormal   Collection Time: 07/25/21  3:10 AM  Result Value Ref Range   Sodium 127 (L) 135 - 145 mmol/L   Potassium 4.8 3.5 - 5.1 mmol/L   Chloride 96 (L) 98 - 111 mmol/L   CO2 25 22 - 32 mmol/L   Glucose, Bld 110 (H) 70 - 99 mg/dL   BUN 10 8 - 23 mg/dL   Creatinine, Ser 0.48 0.44 - 1.00 mg/dL   Calcium 8.5 (L) 8.9 - 10.3 mg/dL   GFR, Estimated >60 >60 mL/min   Anion gap 6 5 - 15    DG Knee Right Port  Result Date: 07/24/2021 CLINICAL DATA:  Status post right knee total arthroplasty EXAM: PORTABLE RIGHT KNEE - 1-2 VIEW COMPARISON:  None. FINDINGS: Status post right  knee total arthroplasty with expected overlying postoperative change. No evidence of perihardware fracture or component malpositioning. IMPRESSION: Status post right knee total arthroplasty with expected overlying postoperative change. No evidence of perihardware fracture or component malpositioning. Electronically Signed   By: Delanna Ahmadi M.D.   On: 07/24/2021 14:46    Assessment/Plan: Principal Problem:   Osteoarthritis of right knee Active Problems:   S/P total knee arthroplasty, right 1 Day Post-Op s/p Procedure(s): RIGHT TOTAL KNEE ARTHROPLASTY Weightbearing: WBAT RLE, up with therapy Insicional and dressing care: Reinforce dressings as needed VTE prophylaxis: Aspirin 325mg  BID  x 30 days Pain control: continue current regimen Follow - up plan: 2 weeks with Dr.Landau  Hyponatremia: - Sodium of 127 this morning, will d/c current IV fluid and recheck at noon  Dispo: pending progress with PT today for safe discharge   Contact information:   Weekdays 8-5 Merlene Pulling, PA-C [phone] A fter hours and holidays please check [email or web address] for group call information for [facility]  Ventura Bruns 07/25/2021, 7:57 AM

## 2021-07-25 NOTE — TOC Transition Note (Signed)
Transition of Care [facility]) - CM/SW Discharge Note   Patient Details  Name: Abigail Flores MRN: [id number] Date of Birth: 07/23/1961  Transition of [facility]) CM/SW Contact:  Lennart Pall, LCSW Phone Number: 07/25/2021, 9:35 AM   Clinical Narrative:    Met with pt and spouse and confirming she has all needed DME at home. Plan for OPPT at [facility] in [place].  No further TOC needs.   Final next level of care: OP Rehab Barriers to Discharge: No Barriers Identified   Patient Goals and CMS Choice Patient states their goals for this hospitalization and ongoing recovery are:: return home      Discharge Placement                       Discharge Plan and Services                DME Arranged: N/A DME Agency: NA                  Social Determinants of Health (SDOH) Interventions     Readmission Risk Interventions No flowsheet data found.

## 2021-07-25 NOTE — Plan of Care (Signed)
Plan of care reviewed and discussed with the patient and her husband. 

## 2021-07-27 ENCOUNTER — Encounter (HOSPITAL_COMMUNITY): Payer: Self-pay | Admitting: Orthopedic Surgery

## 2021-07-27 DIAGNOSIS — M25561 Pain in right knee: Secondary | ICD-10-CM | POA: Diagnosis not present

## 2021-07-27 DIAGNOSIS — Z471 Aftercare following joint replacement surgery: Secondary | ICD-10-CM | POA: Diagnosis not present

## 2021-07-30 DIAGNOSIS — M25561 Pain in right knee: Secondary | ICD-10-CM | POA: Diagnosis not present
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
## 2021-08-01 DIAGNOSIS — M25561 Pain in right knee: Secondary | ICD-10-CM | POA: Diagnosis not present

## 2021-08-03 DIAGNOSIS — M25561 Pain in right knee: Secondary | ICD-10-CM | POA: Diagnosis not present

## 2021-08-06 DIAGNOSIS — Z471 Aftercare following joint replacement surgery: Secondary | ICD-10-CM | POA: Diagnosis not present

## 2021-08-06 DIAGNOSIS — M25561 Pain in right knee: Secondary | ICD-10-CM | POA: Diagnosis not present

## 2021-08-08 DIAGNOSIS — Z471 Aftercare following joint replacement surgery: Secondary | ICD-10-CM | POA: Diagnosis not present

## 2021-08-08 DIAGNOSIS — M1711 Unilateral primary osteoarthritis, right knee: Secondary | ICD-10-CM | POA: Diagnosis not present

## 2021-08-08 DIAGNOSIS — M25561 Pain in right knee: Secondary | ICD-10-CM | POA: Diagnosis not present

## 2021-08-10 DIAGNOSIS — Z471 Aftercare following joint replacement surgery: Secondary | ICD-10-CM | POA: Diagnosis not present

## 2021-08-10 DIAGNOSIS — M25561 Pain in right knee: Secondary | ICD-10-CM | POA: Diagnosis not present

## 2021-08-13 DIAGNOSIS — M25561 Pain in right knee: Secondary | ICD-10-CM | POA: Diagnosis not present

## 2021-08-13 DIAGNOSIS — Z471 Aftercare following joint replacement surgery: Secondary | ICD-10-CM | POA: Diagnosis not present

## 2021-08-15 DIAGNOSIS — Z471 Aftercare following joint replacement surgery: Secondary | ICD-10-CM | POA: Diagnosis not present

## 2021-08-15 DIAGNOSIS — M25561 Pain in right knee: Secondary | ICD-10-CM | POA: Diagnosis not present

## 2021-08-16 DIAGNOSIS — C029 Malignant neoplasm of tongue, unspecified: Secondary | ICD-10-CM | POA: Diagnosis not present

## 2021-08-17 DIAGNOSIS — M25561 Pain in right knee: Secondary | ICD-10-CM | POA: Diagnosis not present

## 2021-08-17 DIAGNOSIS — Z471 Aftercare following joint replacement surgery: Secondary | ICD-10-CM | POA: Diagnosis not present

## 2021-08-20 DIAGNOSIS — Z471 Aftercare following joint replacement surgery: Secondary | ICD-10-CM | POA: Diagnosis not present

## 2021-08-20 DIAGNOSIS — M25561 Pain in right knee: Secondary | ICD-10-CM | POA: Diagnosis not present

## 2021-08-22 DIAGNOSIS — M25561 Pain in right knee: Secondary | ICD-10-CM | POA: Diagnosis not present

## 2021-08-22 DIAGNOSIS — Z471 Aftercare following joint replacement surgery: Secondary | ICD-10-CM | POA: Diagnosis not present

## 2021-08-28 DIAGNOSIS — M25561 Pain in right knee: Secondary | ICD-10-CM | POA: Diagnosis not present

## 2021-08-28 DIAGNOSIS — Z471 Aftercare following joint replacement surgery: Secondary | ICD-10-CM | POA: Diagnosis not present

## 2021-08-29 DIAGNOSIS — Z471 Aftercare following joint replacement surgery: Secondary | ICD-10-CM | POA: Diagnosis not present

## 2021-08-29 DIAGNOSIS — M25561 Pain in right knee: Secondary | ICD-10-CM | POA: Diagnosis not present

## 2021-08-31 DIAGNOSIS — Z471 Aftercare following joint replacement surgery: Secondary | ICD-10-CM | POA: Diagnosis not present

## 2021-08-31 DIAGNOSIS — M25561 Pain in right knee: Secondary | ICD-10-CM | POA: Diagnosis not present

## 2021-09-03 DIAGNOSIS — H524 Presbyopia: Secondary | ICD-10-CM | POA: Diagnosis not present

## 2021-09-03 DIAGNOSIS — H2513 Age-related nuclear cataract, bilateral: Secondary | ICD-10-CM | POA: Diagnosis not present

## 2021-09-04 DIAGNOSIS — M25561 Pain in right knee: Secondary | ICD-10-CM | POA: Diagnosis not present

## 2021-09-04 DIAGNOSIS — Z471 Aftercare following joint replacement surgery: Secondary | ICD-10-CM | POA: Diagnosis not present
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
## 2021-09-05 DIAGNOSIS — M25561 Pain in right knee: Secondary | ICD-10-CM | POA: Diagnosis not present

## 2021-09-05 DIAGNOSIS — Z471 Aftercare following joint replacement surgery: Secondary | ICD-10-CM | POA: Diagnosis not present

## 2021-09-07 DIAGNOSIS — Z471 Aftercare following joint replacement surgery: Secondary | ICD-10-CM | POA: Diagnosis not present

## 2021-09-07 DIAGNOSIS — M25561 Pain in right knee: Secondary | ICD-10-CM | POA: Diagnosis not present

## 2021-09-10 ENCOUNTER — Ambulatory Visit: Payer: BC Managed Care – PPO | Admitting: Obstetrics and Gynecology

## 2021-09-10 ENCOUNTER — Other Ambulatory Visit: Payer: Self-pay

## 2021-09-10 ENCOUNTER — Encounter: Payer: Self-pay | Admitting: Obstetrics and Gynecology

## 2021-09-10 ENCOUNTER — Ambulatory Visit (INDEPENDENT_AMBULATORY_CARE_PROVIDER_SITE_OTHER): Payer: BC Managed Care – PPO | Admitting: Obstetrics and Gynecology

## 2021-09-10 VITALS — BP 134/72 | HR 82 | Ht 64.0 in | Wt 160.0 lb

## 2021-09-10 DIAGNOSIS — N898 Other specified noninflammatory disorders of vagina: Secondary | ICD-10-CM | POA: Diagnosis not present

## 2021-09-10 DIAGNOSIS — G20 Parkinson's disease: Secondary | ICD-10-CM | POA: Insufficient documentation

## 2021-09-10 DIAGNOSIS — M25561 Pain in right knee: Secondary | ICD-10-CM | POA: Diagnosis not present

## 2021-09-10 DIAGNOSIS — Z01419 Encounter for gynecological examination (general) (routine) without abnormal findings: Secondary | ICD-10-CM

## 2021-09-10 DIAGNOSIS — Z471 Aftercare following joint replacement surgery: Secondary | ICD-10-CM | POA: Diagnosis not present

## 2021-09-10 NOTE — Patient Instructions (Addendum)
Try replense vaginal moisturizer for dryness  Try uberlube for lubrication during intercourse.   EXERCISE   We recommended that you start or continue a regular exercise program for good health. Physical activity is anything that gets your body moving, some is better than none. The CDC recommends 150 minutes per week of Moderate-Intensity Aerobic Activity and 2 or more days of Muscle Strengthening Activity.  Benefits of exercise are limitless: helps weight loss/weight maintenance, improves mood and energy, helps with depression and anxiety, improves sleep, tones and strengthens muscles, improves balance, improves bone density, protects from chronic conditions such as heart disease, high blood pressure and diabetes and so much more. To learn more visit: WhyNotPoker.uy  DIET: Good nutrition starts with a healthy diet of fruits, vegetables, whole grains, and lean protein sources. Drink plenty of water for hydration. Minimize empty calories, sodium, sweets. For more information about dietary recommendations visit: GeekRegister.com.ee and http://schaefer-mitchell.com/  ALCOHOL:  Women should limit their alcohol intake to no more than 7 drinks/beers/glasses of wine (combined, not each!) per week. Moderation of alcohol intake to this level decreases your risk of breast cancer and liver damage.  If you are concerned that you may have a problem, or your friends have told you they are concerned about your drinking, there are many resources to help. A well-known program that is free, effective, and available to all people all over the nation is Alcoholics Anonymous.  Check out this site to learn more: BlockTaxes.se   CALCIUM AND VITAMIN D:  Adequate intake of calcium and Vitamin D are recommended for bone health.  You should be getting between 1000-1200 mg of calcium and 800 units of Vitamin D daily between diet and  supplements  PAP SMEARS:  Pap smears, to check for cervical cancer or precancers,  have traditionally been done yearly, scientific advances have shown that most women can have pap smears less often.  However, every woman still should have a physical exam from her gynecologist every year. It will include a breast check, inspection of the vulva and vagina to check for abnormal growths or skin changes, a visual exam of the cervix, and then an exam to evaluate the size and shape of the uterus and ovaries. We will also provide age appropriate advice regarding health maintenance, like when you should have certain vaccines, screening for sexually transmitted diseases, bone density testing, colonoscopy, mammograms, etc.   MAMMOGRAMS:  All women over 6 years old should have a routine mammogram.   COLON CANCER SCREENING: Now recommend starting at age 79. At this time colonoscopy is not covered for routine screening until 50. There are take home tests that can be done between 45-49.   COLONOSCOPY:  Colonoscopy to screen for colon cancer is recommended for all women at age 90.  We know, you hate the idea of the prep.  We agree, BUT, having colon cancer and not knowing it is worse!!  Colon cancer so often starts as a polyp that can be seen and removed at colonscopy, which can quite literally save your life!  And if your first colonoscopy is normal and you have no family history of colon cancer, most women don't have to have it again for 10 years.  Once every ten years, you can do something that may end up saving your life, right?  We will be happy to help you get it scheduled when you are ready.  Be sure to check your insurance coverage so you understand how much it will cost.  It may be covered as a preventative service at no cost, but you should check your particular policy.      Breast Self-Awareness Breast self-awareness means being familiar with how your breasts look and feel. It involves checking your breasts  regularly and reporting any changes to your health care provider. Practicing breast self-awareness is important. A change in your breasts can be a sign of a serious medical problem. Being familiar with how your breasts look and feel allows you to find any problems early, when treatment is more likely to be successful. All women should practice breast self-awareness, including women who have had breast implants. How to do a breast self-exam One way to learn what is normal for your breasts and whether your breasts are changing is to do a breast self-exam. To do a breast self-exam: Look for Changes  Remove all the clothing above your waist. Stand in front of a mirror in a room with good lighting. Put your hands on your hips. Push your hands firmly downward. Compare your breasts in the mirror. Look for differences between them (asymmetry), such as: Differences in shape. Differences in size. Puckers, dips, and bumps in one breast and not the other. Look at each breast for changes in your skin, such as: Redness. Scaly areas. Look for changes in your nipples, such as: Discharge. Bleeding. Dimpling. Redness. A change in position. Feel for Changes Carefully feel your breasts for lumps and changes. It is best to do this while lying on your back on the floor and again while sitting or standing in the shower or tub with soapy water on your skin. Feel each breast in the following way: Place the arm on the side of the breast you are examining above your head. Feel your breast with the other hand. Start in the nipple area and make  inch (2 cm) overlapping circles to feel your breast. Use the pads of your three middle fingers to do this. Apply light pressure, then medium pressure, then firm pressure. The light pressure will allow you to feel the tissue closest to the skin. The medium pressure will allow you to feel the tissue that is a little deeper. The firm pressure will allow you to feel the tissue  close to the ribs. Continue the overlapping circles, moving downward over the breast until you feel your ribs below your breast. Move one finger-width toward the center of the body. Continue to use the  inch (2 cm) overlapping circles to feel your breast as you move slowly up toward your collarbone. Continue the up and down exam using all three pressures until you reach your armpit.  Write Down What You Find  Write down what is normal for each breast and any changes that you find. Keep a written record with breast changes or normal findings for each breast. By writing this information down, you do not need to depend only on memory for size, tenderness, or location. Write down where you are in your menstrual cycle, if you are still menstruating. If you are having trouble noticing differences in your breasts, do not get discouraged. With time you will become more familiar with the variations in your breasts and more comfortable with the exam. How often should I examine my breasts? Examine your breasts every month. If you are breastfeeding, the best time to examine your breasts is after a feeding or after using a breast pump. If you menstruate, the best time to examine your breasts is 5-7 days after your  period is over. During your period, your breasts are lumpier, and it may be more difficult to notice changes. When should I see my health care provider? See your health care provider if you notice: A change in shape or size of your breasts or nipples. A change in the skin of your breast or nipples, such as a reddened or scaly area. Unusual discharge from your nipples. A lump or thick area that was not there before. Pain in your breasts. Anything that concerns you.

## 2021-09-12 DIAGNOSIS — M25561 Pain in right knee: Secondary | ICD-10-CM | POA: Diagnosis not present

## 2021-09-12 DIAGNOSIS — Z471 Aftercare following joint replacement surgery: Secondary | ICD-10-CM | POA: Diagnosis not present

## 2021-09-14 DIAGNOSIS — Z471 Aftercare following joint replacement surgery: Secondary | ICD-10-CM | POA: Diagnosis not present

## 2021-09-14 DIAGNOSIS — M25561 Pain in right knee: Secondary | ICD-10-CM | POA: Diagnosis not present

## 2021-09-19 DIAGNOSIS — Z471 Aftercare following joint replacement surgery: Secondary | ICD-10-CM | POA: Diagnosis not present

## 2021-09-19 DIAGNOSIS — M25561 Pain in right knee: Secondary | ICD-10-CM | POA: Diagnosis not present

## 2021-09-21 DIAGNOSIS — Z471 Aftercare following joint replacement surgery: Secondary | ICD-10-CM | POA: Diagnosis not present

## 2021-09-21 DIAGNOSIS — M25561 Pain in right knee: Secondary | ICD-10-CM | POA: Diagnosis not present

## 2021-09-26 DIAGNOSIS — M25571 Pain in right ankle and joints of right foot: Secondary | ICD-10-CM | POA: Diagnosis not present

## 2021-10-02 DIAGNOSIS — M25561 Pain in right knee: Secondary | ICD-10-CM | POA: Diagnosis not present

## 2021-10-02 DIAGNOSIS — Z471 Aftercare following joint replacement surgery: Secondary | ICD-10-CM | POA: Diagnosis not present

## 2021-10-08 DIAGNOSIS — E039 Hypothyroidism, unspecified: Secondary | ICD-10-CM | POA: Diagnosis not present

## 2021-10-08 DIAGNOSIS — G4700 Insomnia, unspecified: Secondary | ICD-10-CM | POA: Diagnosis not present

## 2021-10-08 DIAGNOSIS — I10 Essential (primary) hypertension: Secondary | ICD-10-CM | POA: Diagnosis not present

## 2021-10-18 DIAGNOSIS — N3000 Acute cystitis without hematuria: Secondary | ICD-10-CM | POA: Diagnosis not present

## 2021-11-19 ENCOUNTER — Encounter: Payer: Self-pay | Admitting: Internal Medicine

## 2021-11-23 DIAGNOSIS — C029 Malignant neoplasm of tongue, unspecified: Secondary | ICD-10-CM | POA: Diagnosis not present

## 2021-11-23 DIAGNOSIS — C022 Malignant neoplasm of ventral surface of tongue: Secondary | ICD-10-CM | POA: Diagnosis not present

## 2021-12-05 ENCOUNTER — Telehealth: Payer: Self-pay | Admitting: *Deleted

## 2021-12-05 NOTE — Telephone Encounter (Signed)
Noted thank you

## 2021-12-05 NOTE — Telephone Encounter (Signed)
I think it is okay unless the bleeding stopped she could wait but I gather since she scheduled this it still going on.  Hope this makes sense. ?

## 2021-12-05 NOTE — Telephone Encounter (Signed)
DR Carlean Purl, ? 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 ? Her last colon in [place] was 08-02-2013, Posey Pronto, she had 1 HPP-  since it has been a year since her last OV and not 10 yrs from her last colon, I just wanted to verify this colon was okay to proceed with a dx of rectal bleeding?? ? ?Please advise- Thanks Abigail Flores PV  ?

## 2021-12-24 DIAGNOSIS — G20 Parkinson's disease: Secondary | ICD-10-CM | POA: Diagnosis not present

## 2022-01-07 ENCOUNTER — Ambulatory Visit (AMBULATORY_SURGERY_CENTER): Payer: BC Managed Care – PPO

## 2022-01-07 VITALS — Ht 64.0 in | Wt 160.0 lb

## 2022-01-07 DIAGNOSIS — K625 Hemorrhage of anus and rectum: Secondary | ICD-10-CM

## 2022-01-07 NOTE — Progress Notes (Signed)
No egg or soy allergy known to patient  ?No issues known to pt with past sedation with any surgeries or procedures ?Patient denies ever being told they had issues or difficulty with intubation  ?No FH of Malignant Hyperthermia ?Pt is not on diet pills ?Pt is not on  home 02  ?Pt is not on blood thinners  ?Pt denies issues with constipation  ?No A fib or A flutter  ? ? ?PV completed over the phone. Pt verified name, DOB, address and insurance during PV today.  ?Pt mailed instruction packet with copy of consent form to read and not return, and instructions.  ?Pt encouraged to call with questions or issues.  ?If pt has My chart, procedure instructions sent via My Chart  ? ?

## 2022-01-11 ENCOUNTER — Encounter: Payer: Self-pay | Admitting: Internal Medicine

## 2022-01-21 ENCOUNTER — Ambulatory Visit (AMBULATORY_SURGERY_CENTER): Payer: BC Managed Care – PPO | Admitting: Internal Medicine

## 2022-01-21 ENCOUNTER — Encounter: Payer: Self-pay | Admitting: Internal Medicine

## 2022-01-21 VITALS — BP 118/61 | HR 78 | Temp 98.6°F | Resp 11 | Ht 64.0 in | Wt 160.0 lb

## 2022-01-21 DIAGNOSIS — K625 Hemorrhage of anus and rectum: Secondary | ICD-10-CM | POA: Diagnosis not present

## 2022-01-21 DIAGNOSIS — D120 Benign neoplasm of cecum: Secondary | ICD-10-CM

## 2022-01-21 DIAGNOSIS — K626 Ulcer of anus and rectum: Secondary | ICD-10-CM | POA: Diagnosis not present

## 2022-01-21 DIAGNOSIS — D128 Benign neoplasm of rectum: Secondary | ICD-10-CM

## 2022-01-21 DIAGNOSIS — K621 Rectal polyp: Secondary | ICD-10-CM

## 2022-01-21 DIAGNOSIS — K6289 Other specified diseases of anus and rectum: Secondary | ICD-10-CM | POA: Diagnosis not present

## 2022-01-21 DIAGNOSIS — K635 Polyp of colon: Secondary | ICD-10-CM

## 2022-01-21 MED ORDER — SODIUM CHLORIDE 0.9 % IV SOLN: 500.0000 mL | Freq: Once | INTRAVENOUS | Status: DC
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
## 2022-01-21 NOTE — Op Note (Signed)
[facility] ?Patient Name: Abigail Flores ?Procedure Date: 01/21/2022 10:46 AM ?MRN: [id number] ?Endoscopist: Gatha Mayer , MD ?Age: 61 ?Referring MD:  ?Date of Birth: 01-24-1961 ?Gender: Female ?Account #: [id number] ?Procedure:                Colonoscopy ?Indications:              Rectal bleeding ?Medicines:                Monitored Anesthesia Care ?Procedure:                Pre-Anesthesia Assessment: ?                          - Prior to the procedure, a History and Physical  ?                          was performed, and patient medications and  ?                          allergies were reviewed. The patient's tolerance of  ?                          previous anesthesia was also reviewed. The risks  ?                          and benefits of the procedure and the sedation  ?                          options and risks were discussed with the patient.  ?                          All questions were answered, and informed consent  ?                          was obtained. Prior Anticoagulants: The patient has  ?                          taken no previous anticoagulant or antiplatelet  ?                          agents. ASA Grade Assessment: II - A patient with  ?                          mild systemic disease. After reviewing the risks  ?                          and benefits, the patient was deemed in  ?                          satisfactory condition to undergo the procedure. ?                          After obtaining informed consent, the colonoscope  ?  was passed under direct vision. Throughout the  ?                          procedure, the patient's blood pressure, pulse, and  ?                          oxygen saturations were monitored continuously. The  ?                          Olympus PCF-[id number] (LO#[id number]) Colonoscope was  ?                          introduced through the anus and advanced to the the  ?                          cecum, identified by appendiceal orifice and   ?                          ileocecal valve. The colonoscopy was performed  ?                          without difficulty. The patient tolerated the  ?                          procedure well. The quality of the bowel  ?                          preparation was good. The ileocecal valve,  ?                          appendiceal orifice, and rectum were photographed.  ?                          The bowel preparation used was Miralax via split  ?                          dose instruction. ?Scope In: 11:08:36 AM ?Scope Out: 11:26:42 AM ?Scope Withdrawal Time: 0 hours 14 minutes 2 seconds  ?Total Procedure Duration: 0 hours 18 minutes 6 seconds  ?Findings:                 The perianal and digital rectal examinations were  ?                          normal. ?                          A single (solitary) ulcer was found at the anus and  ?                          in the rectum. Biopsies were taken with a cold  ?                          forceps for histology. Verification of patient  ?  identification for the specimen was done. Estimated  ?                          blood loss was minimal. ?                          External and internal hemorrhoids were found. ?                          Two flat and sessile polyps were found in the  ?                          rectum and cecum. The polyps were diminutive in  ?                          size. These polyps were removed with a cold snare.  ?                          Resection and retrieval were complete. Verification  ?                          of patient identification for the specimen was  ?                          done. Estimated blood loss was minimal. ?                          The exam was otherwise without abnormality on  ?                          direct and retroflexion views. ?Complications:            No immediate complications. ?Estimated Blood Loss:     Estimated blood loss was minimal. ?Impression:               - A single (solitary) ulcer  at the anus and in the  ?                          rectum. Biopsied. Think on left lateral hemorrhoid  ?                          column - most likely inflammation but biopsied to  ?                          understand ?                          - External and internal hemorrhoids. ?                          - Two diminutive polyps in the rectum and in the  ?                          cecum, removed with a cold snare. Resected and  ?  retrieved. ?                          - The examination was otherwise normal on direct  ?                          and retroflexion views. ?Recommendation:           - Patient has a contact number available for  ?                          emergencies. The signs and symptoms of potential  ?                          delayed complications were discussed with the  ?                          patient. Return to normal activities tomorrow.  ?                          Written discharge instructions were provided to the  ?                          patient. ?                          - Resume previous diet. ?                          - Continue present medications. ?                          - Await pathology results. ?                          - Repeat colonoscopy is recommended. The  ?                          colonoscopy date will be determined after pathology  ?                          results from today's exam become available for  ?                          review. ?Gatha Mayer, MD ?01/21/2022 11:35:39 AM ?This report has been signed electronically. ?

## 2022-01-21 NOTE — Progress Notes (Signed)
VS completed by DT.  Pt's states no medical or surgical changes since previsit or office visit.  

## 2022-01-21 NOTE — Progress Notes (Signed)
Called to room to assist during endoscopic procedure.  Patient ID and intended procedure confirmed with present staff. Received instructions for my participation in the procedure from the performing physician.  

## 2022-01-21 NOTE — Progress Notes (Signed)
PT taken to PACU. Monitors in place. VSS. Report given to RN. 

## 2022-01-21 NOTE — Patient Instructions (Addendum)
You do have hemorrhoids and on one there was ulceration - I took biopsies to see if that was inflammation vs. Something else like a pre-cancerous change. I favor inflammation but want to be more certain. I will let you know results and recommendations - may need additional evaluation depending upon pathology. You will see some bleeding from this biopsy site. ? ?I also saw and removed 2 very small and benign-appearing polyps. ? ?I appreciate the opportunity to care for you. ?Gatha Mayer, MD, [facility] ? ? ?YOU HAD AN ENDOSCOPIC PROCEDURE TODAY AT [facility]:   Refer to the procedure report that was given to you for any specific questions about what was found during the examination.  If the procedure report does not answer your questions, please call your gastroenterologist to clarify.  If you requested that your care partner not be given the details of your procedure findings, then the procedure report has been included in a sealed envelope for you to review at your convenience later. ? ?YOU SHOULD EXPECT: Some feelings of bloating in the abdomen. Passage of more gas than usual.  Walking can help get rid of the air that was put into your GI tract during the procedure and reduce the bloating. If you had a lower endoscopy (such as a colonoscopy or flexible sigmoidoscopy) you may notice spotting of blood in your stool or on the toilet paper. If you underwent a bowel prep for your procedure, you may not have a normal bowel movement for a few days. ? ?Please Note:  You might notice some irritation and congestion in your nose or some drainage.  This is from the oxygen used during your procedure.  There is no need for concern and it should clear up in a day or so. ? ?SYMPTOMS TO REPORT IMMEDIATELY: ? ?Following lower endoscopy (colonoscopy or flexible sigmoidoscopy): ? Excessive amounts of blood in the stool ? Significant tenderness or worsening of abdominal pains ? Swelling of the abdomen that is new,  acute ? Fever of 100?F or higher ? ? ? ?For urgent or emergent issues, a gastroenterologist can be reached at any hour by calling [phone]. ?Do not use MyChart messaging for urgent concerns.  ? ? ?DIET:  We do recommend a small meal at first, but then you may proceed to your regular diet.  Drink plenty of fluids but you should avoid alcoholic beverages for 24 hours. ? ?ACTIVITY:  You should plan to take it easy for the rest of today and you should NOT DRIVE or use heavy machinery until tomorrow (because of the sedation medicines used during the test).   ? ?FOLLOW UP: ?Our staff will call the number listed on your records 48-72 hours following your procedure to check on you and address any questions or concerns that you may have regarding the information given to you following your procedure. If we do not reach you, we will leave a message.  We will attempt to reach you two times.  During this call, we will ask if you have developed any symptoms of COVID 19. If you develop any symptoms (ie: fever, flu-like symptoms, shortness of breath, cough etc.) before then, please call [phone].  If you test positive for Covid 19 in the 2 weeks post procedure, please call and report this information to [place].   ? ?If any biopsies were taken you will be contacted by phone or by letter within the next 1-3 weeks.  Please call us at [phone]  if you have not heard about the biopsies in 3 weeks.  ? ? ?SIGNATURES/CONFIDENTIALITY: ?You and/or your care partner have signed paperwork which will be entered into your electronic medical record.  These signatures attest to the fact that that the information above on your After Visit Summary has been reviewed and is understood.  Full responsibility of the confidentiality of this discharge information lies with you and/or your care-partner.  ?

## 2022-01-25 ENCOUNTER — Encounter: Payer: Self-pay | Admitting: Internal Medicine

## 2022-01-25 DIAGNOSIS — Z860100 Personal history of colon polyps, unspecified: Secondary | ICD-10-CM | POA: Insufficient documentation

## 2022-01-25 HISTORY — DX: Personal history of colonic polyps: Z86.010

## 2022-02-22 DIAGNOSIS — C029 Malignant neoplasm of tongue, unspecified: Secondary | ICD-10-CM | POA: Diagnosis not present

## 2022-04-29 DIAGNOSIS — G20 Parkinson's disease: Secondary | ICD-10-CM | POA: Diagnosis not present

## 2022-05-27 DIAGNOSIS — Z1231 Encounter for screening mammogram for malignant neoplasm of breast: Secondary | ICD-10-CM | POA: Diagnosis not present

## 2022-06-27 DIAGNOSIS — Z85810 Personal history of malignant neoplasm of tongue: Secondary | ICD-10-CM | POA: Diagnosis not present

## 2022-06-27 DIAGNOSIS — Z08 Encounter for follow-up examination after completed treatment for malignant neoplasm: Secondary | ICD-10-CM | POA: Diagnosis not present

## 2022-07-16 DIAGNOSIS — R42 Dizziness and giddiness: Secondary | ICD-10-CM | POA: Diagnosis not present

## 2022-07-16 DIAGNOSIS — R55 Syncope and collapse: Secondary | ICD-10-CM | POA: Diagnosis not present

## 2022-07-16 DIAGNOSIS — I10 Essential (primary) hypertension: Secondary | ICD-10-CM | POA: Diagnosis not present

## 2022-07-16 DIAGNOSIS — R531 Weakness: Secondary | ICD-10-CM | POA: Diagnosis not present

## 2022-09-09 DIAGNOSIS — M1612 Unilateral primary osteoarthritis, left hip: Secondary | ICD-10-CM | POA: Diagnosis not present

## 2022-09-11 ENCOUNTER — Ambulatory Visit: Payer: BC Managed Care – PPO | Admitting: Obstetrics and Gynecology

## 2022-09-25 DIAGNOSIS — Z01812 Encounter for preprocedural laboratory examination: Secondary | ICD-10-CM | POA: Diagnosis not present

## 2022-09-25 DIAGNOSIS — M1612 Unilateral primary osteoarthritis, left hip: Secondary | ICD-10-CM | POA: Diagnosis not present

## 2022-09-25 DIAGNOSIS — M25552 Pain in left hip: Secondary | ICD-10-CM | POA: Diagnosis not present

## 2022-10-07 ENCOUNTER — Ambulatory Visit: Payer: BC Managed Care – PPO | Admitting: Obstetrics and Gynecology

## 2022-10-14 ENCOUNTER — Ambulatory Visit: Payer: BC Managed Care – PPO | Admitting: Nurse Practitioner

## 2022-10-14 NOTE — Progress Notes (Deleted)
   Abigail Flores 04-14-1961 [id number]   History:  62 y.o. G2P0010 presents for annual exam. Postmenopausal - no HRT, no bleeding. Normal pap history. H/O parkinson's, HTN, hypothyroidism, tongue cancer (2019).   Gynecologic History Patient's last menstrual period was 04/02/2012 (exact date).   Contraception/Family planning: post menopausal status Sexually active: ***  Health Maintenance Last Pap: 08/13/2017. Results were: Normal neg HPV Last mammogram: ***. Results were:  Last colonoscopy: 01/21/2022. Results were: SSP, ulcer. 7-year recall Last Dexa: ***. Results were:   Past medical history, past surgical history, family history and social history were all reviewed and documented in the EPIC chart.  ROS:  A ROS was performed and pertinent positives and negatives are included.  Exam:  There were no vitals filed for this visit. There is no height or weight on file to calculate BMI.  General appearance:  Normal Thyroid:  Symmetrical, normal in size, without palpable masses or nodularity. Respiratory  Auscultation:  Clear without wheezing or rhonchi Cardiovascular  Auscultation:  Regular rate, without rubs, murmurs or gallops  Edema/varicosities:  Not grossly evident Abdominal  Soft,nontender, without masses, guarding or rebound.  Liver/spleen:  No organomegaly noted  Hernia:  None appreciated  Skin  Inspection:  Grossly normal Breasts: Examined lying and sitting.   Right: Without masses, retractions, nipple discharge or axillary adenopathy.   Left: Without masses, retractions, nipple discharge or axillary adenopathy. Genitourinary   Inguinal/mons:  Normal without inguinal adenopathy  External genitalia:  Normal appearing vulva with no masses, tenderness, or lesions  BUS/Urethra/Skene's glands:  Normal  Vagina:  Normal appearing with normal color and discharge, no lesions  Cervix:  Normal appearing without discharge or lesions  Uterus:  Normal in size, shape and contour.   Midline and mobile, nontender  Adnexa/parametria:     Rt: Normal in size, without masses or tenderness.   Lt: Normal in size, without masses or tenderness.  Anus and perineum: Normal  Digital rectal exam: Normal sphincter tone without palpated masses or tenderness  Patient informed chaperone available to be present for breast and pelvic exam. Patient has requested no chaperone to be present. Patient has been advised what will be completed during breast and pelvic exam.   Assessment/Plan:  62 y.o. G2P1010 for annual exam.    Return in 1 year for annual.   Tamela Gammon DNP, 12:47 PM 10/14/2022

## 2022-11-04 DIAGNOSIS — G20A1 Parkinson's disease without dyskinesia, without mention of fluctuations: Secondary | ICD-10-CM | POA: Diagnosis not present

## 2022-11-04 DIAGNOSIS — I10 Essential (primary) hypertension: Secondary | ICD-10-CM | POA: Diagnosis not present

## 2022-11-04 DIAGNOSIS — Z6830 Body mass index (BMI) 30.0-30.9, adult: Secondary | ICD-10-CM | POA: Diagnosis not present

## 2022-11-04 DIAGNOSIS — Z0000 Encounter for general adult medical examination without abnormal findings: Secondary | ICD-10-CM | POA: Diagnosis not present

## 2022-11-04 DIAGNOSIS — M1990 Unspecified osteoarthritis, unspecified site: Secondary | ICD-10-CM | POA: Diagnosis not present

## 2022-11-05 DIAGNOSIS — M1612 Unilateral primary osteoarthritis, left hip: Secondary | ICD-10-CM | POA: Diagnosis not present

## 2022-11-12 DIAGNOSIS — Z471 Aftercare following joint replacement surgery: Secondary | ICD-10-CM | POA: Diagnosis not present

## 2022-11-12 DIAGNOSIS — M25552 Pain in left hip: Secondary | ICD-10-CM | POA: Diagnosis not present

## 2022-11-14 DIAGNOSIS — Z471 Aftercare following joint replacement surgery: Secondary | ICD-10-CM | POA: Diagnosis not present

## 2022-11-14 DIAGNOSIS — M25552 Pain in left hip: Secondary | ICD-10-CM | POA: Diagnosis not present

## 2022-11-15 DIAGNOSIS — M25552 Pain in left hip: Secondary | ICD-10-CM | POA: Diagnosis not present

## 2022-11-15 DIAGNOSIS — Z471 Aftercare following joint replacement surgery: Secondary | ICD-10-CM | POA: Diagnosis not present

## 2022-11-18 DIAGNOSIS — M1612 Unilateral primary osteoarthritis, left hip: Secondary | ICD-10-CM | POA: Diagnosis not present

## 2022-11-19 DIAGNOSIS — Z471 Aftercare following joint replacement surgery: Secondary | ICD-10-CM | POA: Diagnosis not present

## 2022-11-19 DIAGNOSIS — M25552 Pain in left hip: Secondary | ICD-10-CM | POA: Diagnosis not present

## 2022-11-21 DIAGNOSIS — M25552 Pain in left hip: Secondary | ICD-10-CM | POA: Diagnosis not present

## 2022-11-21 DIAGNOSIS — Z471 Aftercare following joint replacement surgery: Secondary | ICD-10-CM | POA: Diagnosis not present

## 2022-11-22 DIAGNOSIS — M25552 Pain in left hip: Secondary | ICD-10-CM | POA: Diagnosis not present

## 2022-11-22 DIAGNOSIS — Z471 Aftercare following joint replacement surgery: Secondary | ICD-10-CM | POA: Diagnosis not present

## 2022-11-25 DIAGNOSIS — M25552 Pain in left hip: Secondary | ICD-10-CM | POA: Diagnosis not present

## 2022-11-25 DIAGNOSIS — Z471 Aftercare following joint replacement surgery: Secondary | ICD-10-CM | POA: Diagnosis not present

## 2022-11-26 DIAGNOSIS — Z471 Aftercare following joint replacement surgery: Secondary | ICD-10-CM | POA: Diagnosis not present

## 2022-11-26 DIAGNOSIS — M25552 Pain in left hip: Secondary | ICD-10-CM | POA: Diagnosis not present

## 2022-11-27 DIAGNOSIS — Z471 Aftercare following joint replacement surgery: Secondary | ICD-10-CM | POA: Diagnosis not present

## 2022-11-27 DIAGNOSIS — M25552 Pain in left hip: Secondary | ICD-10-CM | POA: Diagnosis not present

## 2022-12-06 DIAGNOSIS — Z85810 Personal history of malignant neoplasm of tongue: Secondary | ICD-10-CM | POA: Diagnosis not present

## 2022-12-06 DIAGNOSIS — Z08 Encounter for follow-up examination after completed treatment for malignant neoplasm: Secondary | ICD-10-CM | POA: Diagnosis not present

## 2022-12-09 DIAGNOSIS — M25552 Pain in left hip: Secondary | ICD-10-CM | POA: Diagnosis not present

## 2022-12-09 DIAGNOSIS — Z471 Aftercare following joint replacement surgery: Secondary | ICD-10-CM | POA: Diagnosis not present

## 2022-12-16 DIAGNOSIS — M1612 Unilateral primary osteoarthritis, left hip: Secondary | ICD-10-CM | POA: Diagnosis not present

## 2023-02-11 DIAGNOSIS — N390 Urinary tract infection, site not specified: Secondary | ICD-10-CM | POA: Diagnosis not present

## 2023-02-11 DIAGNOSIS — R8279 Other abnormal findings on microbiological examination of urine: Secondary | ICD-10-CM | POA: Diagnosis not present

## 2023-04-02 DIAGNOSIS — E039 Hypothyroidism, unspecified: Secondary | ICD-10-CM | POA: Diagnosis not present

## 2023-04-02 DIAGNOSIS — I10 Essential (primary) hypertension: Secondary | ICD-10-CM | POA: Diagnosis not present

## 2023-04-02 DIAGNOSIS — Z6830 Body mass index (BMI) 30.0-30.9, adult: Secondary | ICD-10-CM | POA: Diagnosis not present

## 2023-04-02 DIAGNOSIS — R011 Cardiac murmur, unspecified: Secondary | ICD-10-CM | POA: Diagnosis not present

## 2023-04-07 DIAGNOSIS — E039 Hypothyroidism, unspecified: Secondary | ICD-10-CM | POA: Diagnosis not present

## 2023-04-11 DIAGNOSIS — Z09 Encounter for follow-up examination after completed treatment for conditions other than malignant neoplasm: Secondary | ICD-10-CM | POA: Diagnosis not present

## 2023-04-11 DIAGNOSIS — Z08 Encounter for follow-up examination after completed treatment for malignant neoplasm: Secondary | ICD-10-CM | POA: Diagnosis not present

## 2023-04-11 DIAGNOSIS — Z85810 Personal history of malignant neoplasm of tongue: Secondary | ICD-10-CM | POA: Diagnosis not present

## 2023-04-28 IMAGING — DX DG KNEE 1-2V PORT*R*
2 series · 2 of 2 positions shown · non-contrast
Comparison: None.

CLINICAL DATA: Status post right knee total arthroplasty

EXAM:
PORTABLE RIGHT KNEE - 1-2 VIEW

[knee lat]
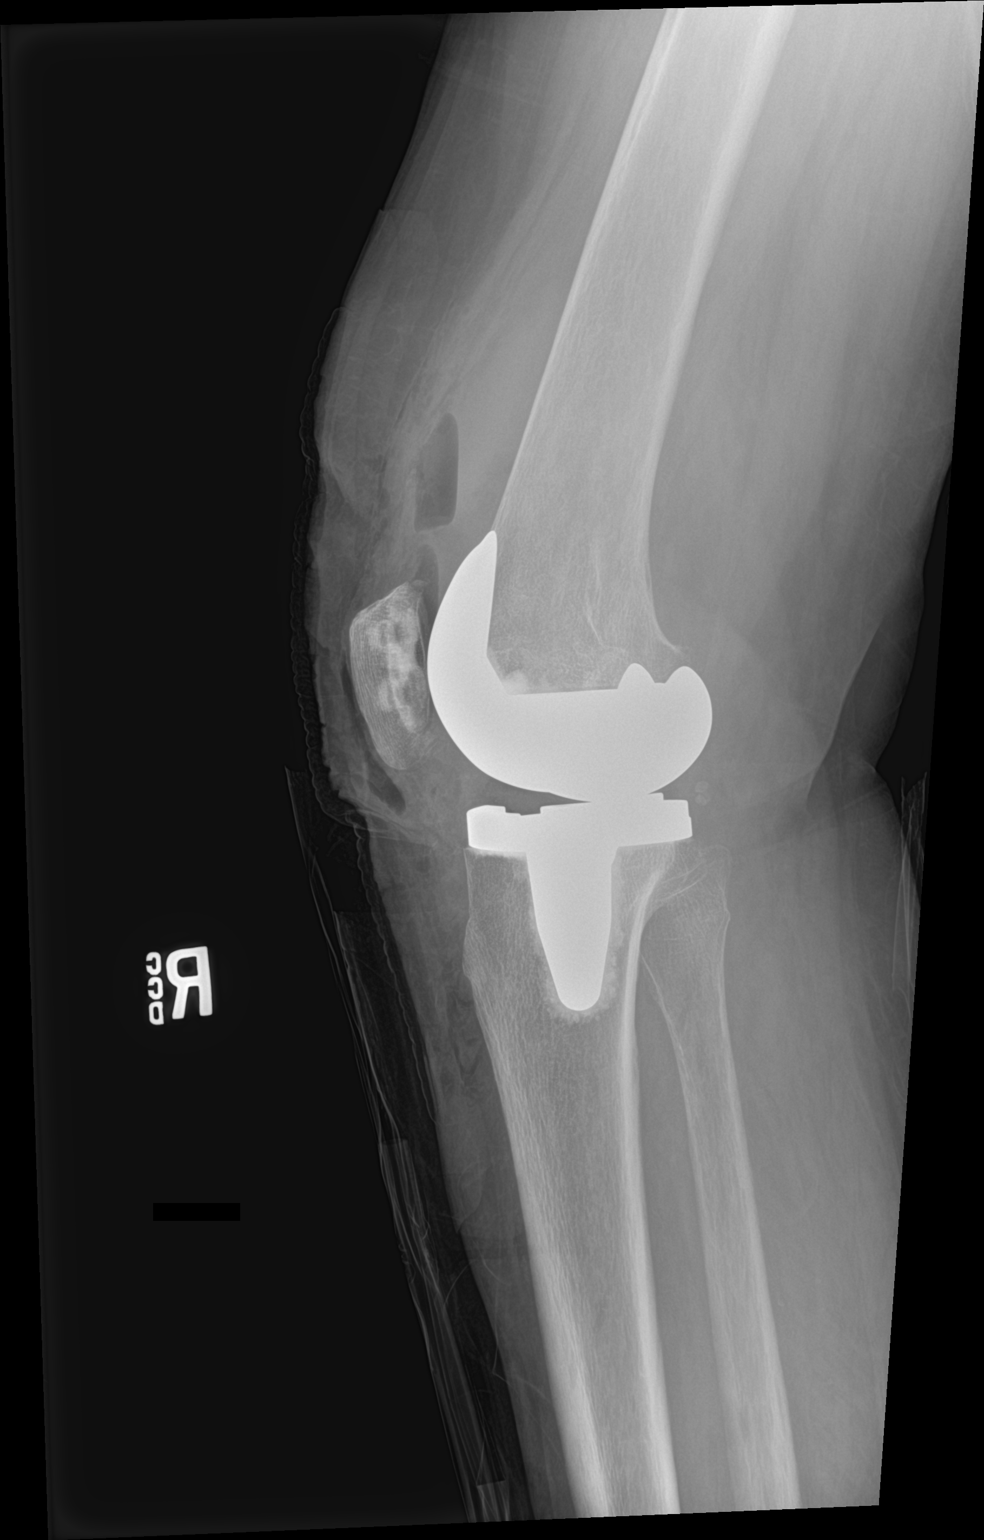

[knee ap]
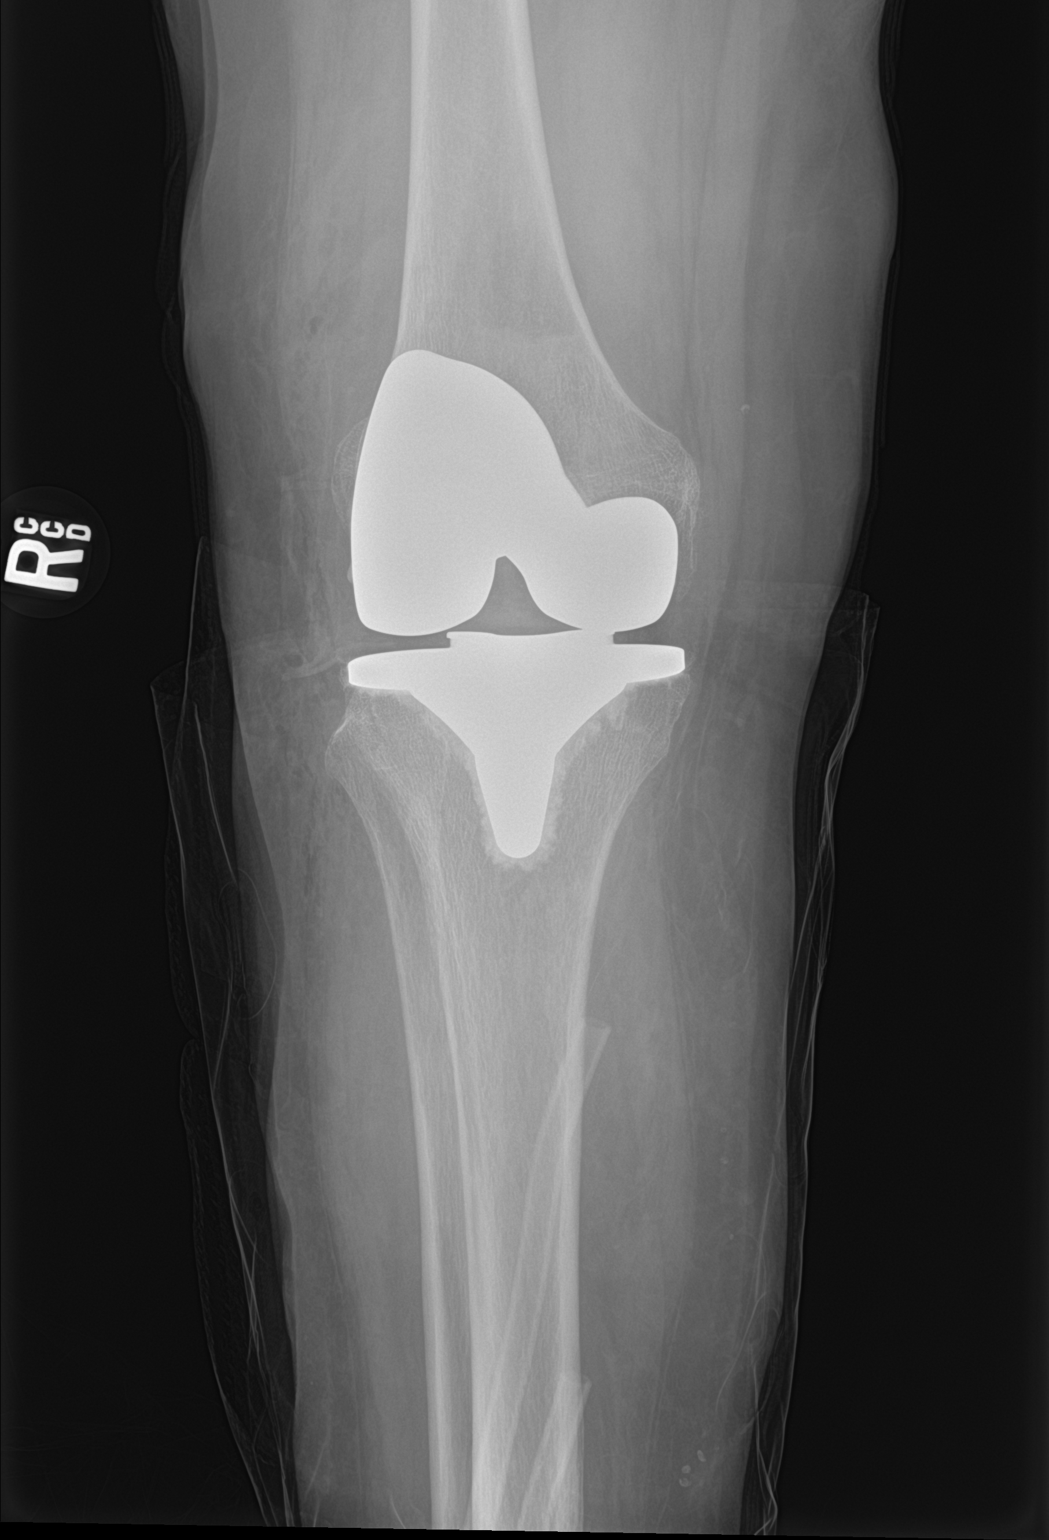

[2 of 2 positions shown; findings below may reference images not displayed]

FINDINGS: Status post right knee total arthroplasty with expected overlying
postoperative change. No evidence of perihardware fracture or
component malpositioning.
IMPRESSION: Status post right knee total arthroplasty with expected overlying
postoperative change. No evidence of perihardware fracture or
component malpositioning.

## 2023-06-30 DIAGNOSIS — Z1231 Encounter for screening mammogram for malignant neoplasm of breast: Secondary | ICD-10-CM | POA: Diagnosis not present

## 2023-08-18 DIAGNOSIS — G20A1 Parkinson's disease without dyskinesia, without mention of fluctuations: Secondary | ICD-10-CM | POA: Diagnosis not present

## 2023-08-18 DIAGNOSIS — G4700 Insomnia, unspecified: Secondary | ICD-10-CM | POA: Diagnosis not present

## 2023-10-06 DIAGNOSIS — E039 Hypothyroidism, unspecified: Secondary | ICD-10-CM | POA: Diagnosis not present

## 2023-10-06 DIAGNOSIS — G20A1 Parkinson's disease without dyskinesia, without mention of fluctuations: Secondary | ICD-10-CM | POA: Diagnosis not present

## 2023-10-06 DIAGNOSIS — Z85810 Personal history of malignant neoplasm of tongue: Secondary | ICD-10-CM | POA: Diagnosis not present

## 2023-10-06 DIAGNOSIS — I10 Essential (primary) hypertension: Secondary | ICD-10-CM | POA: Diagnosis not present

## 2023-10-30 DIAGNOSIS — Z08 Encounter for follow-up examination after completed treatment for malignant neoplasm: Secondary | ICD-10-CM | POA: Diagnosis not present

## 2023-10-30 DIAGNOSIS — Z85810 Personal history of malignant neoplasm of tongue: Secondary | ICD-10-CM | POA: Diagnosis not present

## 2023-11-11 DIAGNOSIS — G20A2 Parkinson's disease without dyskinesia, with fluctuations: Secondary | ICD-10-CM | POA: Diagnosis not present

## 2023-11-11 DIAGNOSIS — M62838 Other muscle spasm: Secondary | ICD-10-CM | POA: Diagnosis not present

## 2024-01-27 DIAGNOSIS — G20A1 Parkinson's disease without dyskinesia, without mention of fluctuations: Secondary | ICD-10-CM | POA: Diagnosis not present

## 2024-01-27 DIAGNOSIS — G20A2 Parkinson's disease without dyskinesia, with fluctuations: Secondary | ICD-10-CM | POA: Diagnosis not present

## 2024-02-11 DIAGNOSIS — G20A1 Parkinson's disease without dyskinesia, without mention of fluctuations: Secondary | ICD-10-CM | POA: Diagnosis not present

## 2024-02-12 DIAGNOSIS — G20A2 Parkinson's disease without dyskinesia, with fluctuations: Secondary | ICD-10-CM | POA: Diagnosis not present

## 2024-02-25 DIAGNOSIS — R42 Dizziness and giddiness: Secondary | ICD-10-CM | POA: Diagnosis not present

## 2024-02-25 DIAGNOSIS — F0630 Mood disorder due to known physiological condition, unspecified: Secondary | ICD-10-CM | POA: Diagnosis not present

## 2024-02-25 DIAGNOSIS — R419 Unspecified symptoms and signs involving cognitive functions and awareness: Secondary | ICD-10-CM | POA: Diagnosis not present

## 2024-02-25 DIAGNOSIS — G20A2 Parkinson's disease without dyskinesia, with fluctuations: Secondary | ICD-10-CM | POA: Diagnosis not present

## 2024-03-14 DIAGNOSIS — G20A2 Parkinson's disease without dyskinesia, with fluctuations: Secondary | ICD-10-CM | POA: Diagnosis not present

## 2024-04-13 DIAGNOSIS — G20A2 Parkinson's disease without dyskinesia, with fluctuations: Secondary | ICD-10-CM | POA: Diagnosis not present

## 2024-04-13 DIAGNOSIS — R441 Visual hallucinations: Secondary | ICD-10-CM | POA: Diagnosis not present

## 2024-04-13 DIAGNOSIS — M62838 Other muscle spasm: Secondary | ICD-10-CM | POA: Diagnosis not present

## 2024-04-13 DIAGNOSIS — F060 Psychotic disorder with hallucinations due to known physiological condition: Secondary | ICD-10-CM | POA: Diagnosis not present

## 2024-04-15 DIAGNOSIS — G20A1 Parkinson's disease without dyskinesia, without mention of fluctuations: Secondary | ICD-10-CM | POA: Diagnosis not present

## 2024-04-15 DIAGNOSIS — F419 Anxiety disorder, unspecified: Secondary | ICD-10-CM | POA: Diagnosis not present

## 2024-04-21 DIAGNOSIS — N6313 Unspecified lump in the right breast, lower outer quadrant: Secondary | ICD-10-CM | POA: Diagnosis not present

## 2024-04-26 DIAGNOSIS — Y92009 Unspecified place in unspecified non-institutional (private) residence as the place of occurrence of the external cause: Secondary | ICD-10-CM | POA: Diagnosis not present

## 2024-04-26 DIAGNOSIS — S299XXA Unspecified injury of thorax, initial encounter: Secondary | ICD-10-CM | POA: Diagnosis not present

## 2024-04-26 DIAGNOSIS — R0602 Shortness of breath: Secondary | ICD-10-CM | POA: Diagnosis not present

## 2024-04-26 DIAGNOSIS — S60221A Contusion of right hand, initial encounter: Secondary | ICD-10-CM | POA: Diagnosis not present

## 2024-04-26 DIAGNOSIS — G20C Parkinsonism, unspecified: Secondary | ICD-10-CM | POA: Diagnosis not present

## 2024-04-26 DIAGNOSIS — G20A1 Parkinson's disease without dyskinesia, without mention of fluctuations: Secondary | ICD-10-CM | POA: Diagnosis not present

## 2024-04-26 DIAGNOSIS — W1830XA Fall on same level, unspecified, initial encounter: Secondary | ICD-10-CM | POA: Diagnosis not present

## 2024-04-26 DIAGNOSIS — R519 Headache, unspecified: Secondary | ICD-10-CM | POA: Diagnosis not present

## 2024-04-26 DIAGNOSIS — S20211A Contusion of right front wall of thorax, initial encounter: Secondary | ICD-10-CM | POA: Diagnosis not present

## 2024-05-09 DIAGNOSIS — R519 Headache, unspecified: Secondary | ICD-10-CM | POA: Diagnosis not present

## 2024-05-09 DIAGNOSIS — M25552 Pain in left hip: Secondary | ICD-10-CM | POA: Diagnosis not present

## 2024-05-09 DIAGNOSIS — Y92009 Unspecified place in unspecified non-institutional (private) residence as the place of occurrence of the external cause: Secondary | ICD-10-CM | POA: Diagnosis not present

## 2024-05-09 DIAGNOSIS — S199XXA Unspecified injury of neck, initial encounter: Secondary | ICD-10-CM | POA: Diagnosis not present

## 2024-05-09 DIAGNOSIS — G20A1 Parkinson's disease without dyskinesia, without mention of fluctuations: Secondary | ICD-10-CM | POA: Diagnosis not present

## 2024-05-09 DIAGNOSIS — Z96642 Presence of left artificial hip joint: Secondary | ICD-10-CM | POA: Diagnosis not present

## 2024-05-09 DIAGNOSIS — E039 Hypothyroidism, unspecified: Secondary | ICD-10-CM | POA: Diagnosis not present

## 2024-05-09 DIAGNOSIS — Z9181 History of falling: Secondary | ICD-10-CM | POA: Diagnosis not present

## 2024-05-09 DIAGNOSIS — Z79899 Other long term (current) drug therapy: Secondary | ICD-10-CM | POA: Diagnosis not present

## 2024-05-09 DIAGNOSIS — F0282 Dementia in other diseases classified elsewhere, unspecified severity, with psychotic disturbance: Secondary | ICD-10-CM | POA: Diagnosis not present

## 2024-05-09 DIAGNOSIS — M79652 Pain in left thigh: Secondary | ICD-10-CM | POA: Diagnosis not present

## 2024-05-09 DIAGNOSIS — Z91148 Patient's other noncompliance with medication regimen for other reason: Secondary | ICD-10-CM | POA: Diagnosis not present

## 2024-05-09 DIAGNOSIS — Z85810 Personal history of malignant neoplasm of tongue: Secondary | ICD-10-CM | POA: Diagnosis not present

## 2024-05-09 DIAGNOSIS — Z66 Do not resuscitate: Secondary | ICD-10-CM | POA: Diagnosis not present

## 2024-05-09 DIAGNOSIS — S60221A Contusion of right hand, initial encounter: Secondary | ICD-10-CM | POA: Diagnosis not present

## 2024-05-09 DIAGNOSIS — S299XXA Unspecified injury of thorax, initial encounter: Secondary | ICD-10-CM | POA: Diagnosis not present

## 2024-05-09 DIAGNOSIS — W19XXXA Unspecified fall, initial encounter: Secondary | ICD-10-CM | POA: Diagnosis not present

## 2024-05-09 DIAGNOSIS — S79912A Unspecified injury of left hip, initial encounter: Secondary | ICD-10-CM | POA: Diagnosis not present

## 2024-05-09 DIAGNOSIS — M542 Cervicalgia: Secondary | ICD-10-CM | POA: Diagnosis not present

## 2024-05-09 DIAGNOSIS — M25551 Pain in right hip: Secondary | ICD-10-CM | POA: Diagnosis not present

## 2024-05-09 DIAGNOSIS — I10 Essential (primary) hypertension: Secondary | ICD-10-CM | POA: Diagnosis not present

## 2024-05-09 DIAGNOSIS — F411 Generalized anxiety disorder: Secondary | ICD-10-CM | POA: Diagnosis not present

## 2024-05-09 DIAGNOSIS — R2689 Other abnormalities of gait and mobility: Secondary | ICD-10-CM | POA: Diagnosis not present

## 2024-05-09 DIAGNOSIS — G928 Other toxic encephalopathy: Secondary | ICD-10-CM | POA: Diagnosis not present

## 2024-05-09 DIAGNOSIS — R296 Repeated falls: Secondary | ICD-10-CM | POA: Diagnosis not present

## 2024-05-09 DIAGNOSIS — Z79890 Hormone replacement therapy (postmenopausal): Secondary | ICD-10-CM | POA: Diagnosis not present

## 2024-05-10 DIAGNOSIS — M25552 Pain in left hip: Secondary | ICD-10-CM | POA: Diagnosis not present

## 2024-05-10 DIAGNOSIS — R296 Repeated falls: Secondary | ICD-10-CM | POA: Diagnosis not present

## 2024-05-11 DIAGNOSIS — G311 Senile degeneration of brain, not elsewhere classified: Secondary | ICD-10-CM | POA: Diagnosis not present

## 2024-05-11 DIAGNOSIS — R296 Repeated falls: Secondary | ICD-10-CM | POA: Diagnosis not present

## 2024-05-11 DIAGNOSIS — G20B2 Parkinson's disease with dyskinesia, with fluctuations: Secondary | ICD-10-CM | POA: Diagnosis not present

## 2024-05-11 DIAGNOSIS — R531 Weakness: Secondary | ICD-10-CM | POA: Diagnosis not present

## 2024-05-11 DIAGNOSIS — I10 Essential (primary) hypertension: Secondary | ICD-10-CM | POA: Diagnosis not present

## 2024-05-11 DIAGNOSIS — M25552 Pain in left hip: Secondary | ICD-10-CM | POA: Diagnosis not present

## 2024-05-12 DIAGNOSIS — I10 Essential (primary) hypertension: Secondary | ICD-10-CM | POA: Diagnosis not present

## 2024-05-12 DIAGNOSIS — G311 Senile degeneration of brain, not elsewhere classified: Secondary | ICD-10-CM | POA: Diagnosis not present

## 2024-05-12 DIAGNOSIS — R531 Weakness: Secondary | ICD-10-CM | POA: Diagnosis not present

## 2024-05-12 DIAGNOSIS — G20B2 Parkinson's disease with dyskinesia, with fluctuations: Secondary | ICD-10-CM | POA: Diagnosis not present

## 2024-05-12 DIAGNOSIS — R296 Repeated falls: Secondary | ICD-10-CM | POA: Diagnosis not present

## 2024-05-13 DIAGNOSIS — I10 Essential (primary) hypertension: Secondary | ICD-10-CM | POA: Diagnosis not present

## 2024-05-13 DIAGNOSIS — R296 Repeated falls: Secondary | ICD-10-CM | POA: Diagnosis not present

## 2024-05-13 DIAGNOSIS — G311 Senile degeneration of brain, not elsewhere classified: Secondary | ICD-10-CM | POA: Diagnosis not present

## 2024-05-13 DIAGNOSIS — R531 Weakness: Secondary | ICD-10-CM | POA: Diagnosis not present

## 2024-05-13 DIAGNOSIS — G20B2 Parkinson's disease with dyskinesia, with fluctuations: Secondary | ICD-10-CM | POA: Diagnosis not present

## 2024-05-14 DIAGNOSIS — G311 Senile degeneration of brain, not elsewhere classified: Secondary | ICD-10-CM | POA: Diagnosis not present

## 2024-05-14 DIAGNOSIS — R531 Weakness: Secondary | ICD-10-CM | POA: Diagnosis not present

## 2024-05-14 DIAGNOSIS — R296 Repeated falls: Secondary | ICD-10-CM | POA: Diagnosis not present

## 2024-05-14 DIAGNOSIS — G20B2 Parkinson's disease with dyskinesia, with fluctuations: Secondary | ICD-10-CM | POA: Diagnosis not present

## 2024-05-14 DIAGNOSIS — I10 Essential (primary) hypertension: Secondary | ICD-10-CM | POA: Diagnosis not present

## 2024-05-15 DIAGNOSIS — R531 Weakness: Secondary | ICD-10-CM | POA: Diagnosis not present

## 2024-05-15 DIAGNOSIS — G311 Senile degeneration of brain, not elsewhere classified: Secondary | ICD-10-CM | POA: Diagnosis not present

## 2024-05-15 DIAGNOSIS — I10 Essential (primary) hypertension: Secondary | ICD-10-CM | POA: Diagnosis not present

## 2024-05-15 DIAGNOSIS — G20B2 Parkinson's disease with dyskinesia, with fluctuations: Secondary | ICD-10-CM | POA: Diagnosis not present

## 2024-05-15 DIAGNOSIS — R296 Repeated falls: Secondary | ICD-10-CM | POA: Diagnosis not present

## 2024-05-16 DIAGNOSIS — R296 Repeated falls: Secondary | ICD-10-CM | POA: Diagnosis not present

## 2024-05-16 DIAGNOSIS — G311 Senile degeneration of brain, not elsewhere classified: Secondary | ICD-10-CM | POA: Diagnosis not present

## 2024-05-16 DIAGNOSIS — I10 Essential (primary) hypertension: Secondary | ICD-10-CM | POA: Diagnosis not present

## 2024-05-16 DIAGNOSIS — R531 Weakness: Secondary | ICD-10-CM | POA: Diagnosis not present

## 2024-05-16 DIAGNOSIS — G20B2 Parkinson's disease with dyskinesia, with fluctuations: Secondary | ICD-10-CM | POA: Diagnosis not present

## 2024-05-17 DIAGNOSIS — R296 Repeated falls: Secondary | ICD-10-CM | POA: Diagnosis not present

## 2024-05-17 DIAGNOSIS — G20B2 Parkinson's disease with dyskinesia, with fluctuations: Secondary | ICD-10-CM | POA: Diagnosis not present

## 2024-05-17 DIAGNOSIS — I10 Essential (primary) hypertension: Secondary | ICD-10-CM | POA: Diagnosis not present

## 2024-05-17 DIAGNOSIS — R531 Weakness: Secondary | ICD-10-CM | POA: Diagnosis not present

## 2024-05-17 DIAGNOSIS — G311 Senile degeneration of brain, not elsewhere classified: Secondary | ICD-10-CM | POA: Diagnosis not present

## 2024-05-18 DIAGNOSIS — R296 Repeated falls: Secondary | ICD-10-CM | POA: Diagnosis not present

## 2024-05-18 DIAGNOSIS — R531 Weakness: Secondary | ICD-10-CM | POA: Diagnosis not present

## 2024-05-18 DIAGNOSIS — G311 Senile degeneration of brain, not elsewhere classified: Secondary | ICD-10-CM | POA: Diagnosis not present

## 2024-05-18 DIAGNOSIS — I10 Essential (primary) hypertension: Secondary | ICD-10-CM | POA: Diagnosis not present

## 2024-05-18 DIAGNOSIS — G20B2 Parkinson's disease with dyskinesia, with fluctuations: Secondary | ICD-10-CM | POA: Diagnosis not present

## 2024-05-19 DIAGNOSIS — R531 Weakness: Secondary | ICD-10-CM | POA: Diagnosis not present

## 2024-05-19 DIAGNOSIS — R296 Repeated falls: Secondary | ICD-10-CM | POA: Diagnosis not present

## 2024-05-19 DIAGNOSIS — G311 Senile degeneration of brain, not elsewhere classified: Secondary | ICD-10-CM | POA: Diagnosis not present

## 2024-05-19 DIAGNOSIS — G20B2 Parkinson's disease with dyskinesia, with fluctuations: Secondary | ICD-10-CM | POA: Diagnosis not present

## 2024-05-19 DIAGNOSIS — I10 Essential (primary) hypertension: Secondary | ICD-10-CM | POA: Diagnosis not present

## 2024-05-20 DIAGNOSIS — G311 Senile degeneration of brain, not elsewhere classified: Secondary | ICD-10-CM | POA: Diagnosis not present

## 2024-05-20 DIAGNOSIS — R531 Weakness: Secondary | ICD-10-CM | POA: Diagnosis not present

## 2024-05-20 DIAGNOSIS — R296 Repeated falls: Secondary | ICD-10-CM | POA: Diagnosis not present

## 2024-05-20 DIAGNOSIS — I10 Essential (primary) hypertension: Secondary | ICD-10-CM | POA: Diagnosis not present

## 2024-05-20 DIAGNOSIS — G20B2 Parkinson's disease with dyskinesia, with fluctuations: Secondary | ICD-10-CM | POA: Diagnosis not present

## 2024-05-21 DIAGNOSIS — R531 Weakness: Secondary | ICD-10-CM | POA: Diagnosis not present

## 2024-05-21 DIAGNOSIS — I10 Essential (primary) hypertension: Secondary | ICD-10-CM | POA: Diagnosis not present

## 2024-05-21 DIAGNOSIS — G20B2 Parkinson's disease with dyskinesia, with fluctuations: Secondary | ICD-10-CM | POA: Diagnosis not present

## 2024-05-21 DIAGNOSIS — G311 Senile degeneration of brain, not elsewhere classified: Secondary | ICD-10-CM | POA: Diagnosis not present

## 2024-05-21 DIAGNOSIS — R296 Repeated falls: Secondary | ICD-10-CM | POA: Diagnosis not present

## 2024-05-22 DIAGNOSIS — G20B2 Parkinson's disease with dyskinesia, with fluctuations: Secondary | ICD-10-CM | POA: Diagnosis not present

## 2024-05-22 DIAGNOSIS — G311 Senile degeneration of brain, not elsewhere classified: Secondary | ICD-10-CM | POA: Diagnosis not present

## 2024-05-22 DIAGNOSIS — R531 Weakness: Secondary | ICD-10-CM | POA: Diagnosis not present

## 2024-05-22 DIAGNOSIS — I10 Essential (primary) hypertension: Secondary | ICD-10-CM | POA: Diagnosis not present

## 2024-05-22 DIAGNOSIS — R296 Repeated falls: Secondary | ICD-10-CM | POA: Diagnosis not present

## 2024-05-23 DIAGNOSIS — I10 Essential (primary) hypertension: Secondary | ICD-10-CM | POA: Diagnosis not present

## 2024-05-23 DIAGNOSIS — G311 Senile degeneration of brain, not elsewhere classified: Secondary | ICD-10-CM | POA: Diagnosis not present

## 2024-05-23 DIAGNOSIS — G20B2 Parkinson's disease with dyskinesia, with fluctuations: Secondary | ICD-10-CM | POA: Diagnosis not present

## 2024-05-23 DIAGNOSIS — R531 Weakness: Secondary | ICD-10-CM | POA: Diagnosis not present

## 2024-05-23 DIAGNOSIS — R296 Repeated falls: Secondary | ICD-10-CM | POA: Diagnosis not present

## 2024-05-24 DIAGNOSIS — G20B2 Parkinson's disease with dyskinesia, with fluctuations: Secondary | ICD-10-CM | POA: Diagnosis not present

## 2024-05-24 DIAGNOSIS — G311 Senile degeneration of brain, not elsewhere classified: Secondary | ICD-10-CM | POA: Diagnosis not present

## 2024-05-24 DIAGNOSIS — R531 Weakness: Secondary | ICD-10-CM | POA: Diagnosis not present

## 2024-05-24 DIAGNOSIS — I10 Essential (primary) hypertension: Secondary | ICD-10-CM | POA: Diagnosis not present

## 2024-05-24 DIAGNOSIS — R296 Repeated falls: Secondary | ICD-10-CM | POA: Diagnosis not present

## 2024-05-25 DIAGNOSIS — R296 Repeated falls: Secondary | ICD-10-CM | POA: Diagnosis not present

## 2024-05-25 DIAGNOSIS — G311 Senile degeneration of brain, not elsewhere classified: Secondary | ICD-10-CM | POA: Diagnosis not present

## 2024-05-25 DIAGNOSIS — G20B2 Parkinson's disease with dyskinesia, with fluctuations: Secondary | ICD-10-CM | POA: Diagnosis not present

## 2024-05-25 DIAGNOSIS — R531 Weakness: Secondary | ICD-10-CM | POA: Diagnosis not present

## 2024-05-25 DIAGNOSIS — I10 Essential (primary) hypertension: Secondary | ICD-10-CM | POA: Diagnosis not present

## 2024-05-26 DIAGNOSIS — G20B2 Parkinson's disease with dyskinesia, with fluctuations: Secondary | ICD-10-CM | POA: Diagnosis not present

## 2024-05-26 DIAGNOSIS — I10 Essential (primary) hypertension: Secondary | ICD-10-CM | POA: Diagnosis not present

## 2024-05-26 DIAGNOSIS — G311 Senile degeneration of brain, not elsewhere classified: Secondary | ICD-10-CM | POA: Diagnosis not present

## 2024-05-26 DIAGNOSIS — R531 Weakness: Secondary | ICD-10-CM | POA: Diagnosis not present

## 2024-05-26 DIAGNOSIS — R296 Repeated falls: Secondary | ICD-10-CM | POA: Diagnosis not present

## 2024-05-27 DIAGNOSIS — G311 Senile degeneration of brain, not elsewhere classified: Secondary | ICD-10-CM | POA: Diagnosis not present

## 2024-05-27 DIAGNOSIS — R296 Repeated falls: Secondary | ICD-10-CM | POA: Diagnosis not present

## 2024-05-27 DIAGNOSIS — I10 Essential (primary) hypertension: Secondary | ICD-10-CM | POA: Diagnosis not present

## 2024-05-27 DIAGNOSIS — G20B2 Parkinson's disease with dyskinesia, with fluctuations: Secondary | ICD-10-CM | POA: Diagnosis not present

## 2024-05-27 DIAGNOSIS — R531 Weakness: Secondary | ICD-10-CM | POA: Diagnosis not present

## 2024-05-28 DIAGNOSIS — G20B2 Parkinson's disease with dyskinesia, with fluctuations: Secondary | ICD-10-CM | POA: Diagnosis not present

## 2024-05-28 DIAGNOSIS — R531 Weakness: Secondary | ICD-10-CM | POA: Diagnosis not present

## 2024-05-28 DIAGNOSIS — R296 Repeated falls: Secondary | ICD-10-CM | POA: Diagnosis not present

## 2024-05-28 DIAGNOSIS — G311 Senile degeneration of brain, not elsewhere classified: Secondary | ICD-10-CM | POA: Diagnosis not present

## 2024-05-28 DIAGNOSIS — I10 Essential (primary) hypertension: Secondary | ICD-10-CM | POA: Diagnosis not present

## 2024-05-29 DIAGNOSIS — R531 Weakness: Secondary | ICD-10-CM | POA: Diagnosis not present

## 2024-05-29 DIAGNOSIS — R296 Repeated falls: Secondary | ICD-10-CM | POA: Diagnosis not present

## 2024-05-29 DIAGNOSIS — G20B2 Parkinson's disease with dyskinesia, with fluctuations: Secondary | ICD-10-CM | POA: Diagnosis not present

## 2024-05-29 DIAGNOSIS — I10 Essential (primary) hypertension: Secondary | ICD-10-CM | POA: Diagnosis not present

## 2024-05-29 DIAGNOSIS — G311 Senile degeneration of brain, not elsewhere classified: Secondary | ICD-10-CM | POA: Diagnosis not present

## 2024-05-30 DIAGNOSIS — R531 Weakness: Secondary | ICD-10-CM | POA: Diagnosis not present

## 2024-05-30 DIAGNOSIS — G311 Senile degeneration of brain, not elsewhere classified: Secondary | ICD-10-CM | POA: Diagnosis not present

## 2024-05-30 DIAGNOSIS — I10 Essential (primary) hypertension: Secondary | ICD-10-CM | POA: Diagnosis not present

## 2024-05-30 DIAGNOSIS — G20B2 Parkinson's disease with dyskinesia, with fluctuations: Secondary | ICD-10-CM | POA: Diagnosis not present

## 2024-05-30 DIAGNOSIS — R296 Repeated falls: Secondary | ICD-10-CM | POA: Diagnosis not present

## 2024-05-31 DIAGNOSIS — I10 Essential (primary) hypertension: Secondary | ICD-10-CM | POA: Diagnosis not present

## 2024-05-31 DIAGNOSIS — R531 Weakness: Secondary | ICD-10-CM | POA: Diagnosis not present

## 2024-05-31 DIAGNOSIS — G311 Senile degeneration of brain, not elsewhere classified: Secondary | ICD-10-CM | POA: Diagnosis not present

## 2024-05-31 DIAGNOSIS — R296 Repeated falls: Secondary | ICD-10-CM | POA: Diagnosis not present

## 2024-05-31 DIAGNOSIS — G20B2 Parkinson's disease with dyskinesia, with fluctuations: Secondary | ICD-10-CM | POA: Diagnosis not present

## 2024-06-01 DIAGNOSIS — G311 Senile degeneration of brain, not elsewhere classified: Secondary | ICD-10-CM | POA: Diagnosis not present

## 2024-06-01 DIAGNOSIS — I10 Essential (primary) hypertension: Secondary | ICD-10-CM | POA: Diagnosis not present

## 2024-06-01 DIAGNOSIS — G20B2 Parkinson's disease with dyskinesia, with fluctuations: Secondary | ICD-10-CM | POA: Diagnosis not present

## 2024-06-01 DIAGNOSIS — R531 Weakness: Secondary | ICD-10-CM | POA: Diagnosis not present

## 2024-06-01 DIAGNOSIS — R296 Repeated falls: Secondary | ICD-10-CM | POA: Diagnosis not present

## 2024-06-02 DIAGNOSIS — G311 Senile degeneration of brain, not elsewhere classified: Secondary | ICD-10-CM | POA: Diagnosis not present

## 2024-06-02 DIAGNOSIS — G20B2 Parkinson's disease with dyskinesia, with fluctuations: Secondary | ICD-10-CM | POA: Diagnosis not present

## 2024-06-02 DIAGNOSIS — I10 Essential (primary) hypertension: Secondary | ICD-10-CM | POA: Diagnosis not present

## 2024-06-02 DIAGNOSIS — R531 Weakness: Secondary | ICD-10-CM | POA: Diagnosis not present

## 2024-06-02 DIAGNOSIS — R296 Repeated falls: Secondary | ICD-10-CM | POA: Diagnosis not present

## 2024-06-03 DIAGNOSIS — I10 Essential (primary) hypertension: Secondary | ICD-10-CM | POA: Diagnosis not present

## 2024-06-03 DIAGNOSIS — G311 Senile degeneration of brain, not elsewhere classified: Secondary | ICD-10-CM | POA: Diagnosis not present

## 2024-06-03 DIAGNOSIS — R296 Repeated falls: Secondary | ICD-10-CM | POA: Diagnosis not present

## 2024-06-03 DIAGNOSIS — G20B2 Parkinson's disease with dyskinesia, with fluctuations: Secondary | ICD-10-CM | POA: Diagnosis not present

## 2024-06-03 DIAGNOSIS — R531 Weakness: Secondary | ICD-10-CM | POA: Diagnosis not present

## 2024-06-04 DIAGNOSIS — R531 Weakness: Secondary | ICD-10-CM | POA: Diagnosis not present

## 2024-06-04 DIAGNOSIS — I10 Essential (primary) hypertension: Secondary | ICD-10-CM | POA: Diagnosis not present

## 2024-06-04 DIAGNOSIS — G20B2 Parkinson's disease with dyskinesia, with fluctuations: Secondary | ICD-10-CM | POA: Diagnosis not present

## 2024-06-04 DIAGNOSIS — G311 Senile degeneration of brain, not elsewhere classified: Secondary | ICD-10-CM | POA: Diagnosis not present

## 2024-06-04 DIAGNOSIS — R296 Repeated falls: Secondary | ICD-10-CM | POA: Diagnosis not present

## 2024-06-05 DIAGNOSIS — R531 Weakness: Secondary | ICD-10-CM | POA: Diagnosis not present

## 2024-06-05 DIAGNOSIS — I10 Essential (primary) hypertension: Secondary | ICD-10-CM | POA: Diagnosis not present

## 2024-06-05 DIAGNOSIS — G20B2 Parkinson's disease with dyskinesia, with fluctuations: Secondary | ICD-10-CM | POA: Diagnosis not present

## 2024-06-05 DIAGNOSIS — G311 Senile degeneration of brain, not elsewhere classified: Secondary | ICD-10-CM | POA: Diagnosis not present

## 2024-06-05 DIAGNOSIS — R296 Repeated falls: Secondary | ICD-10-CM | POA: Diagnosis not present

## 2024-06-06 DIAGNOSIS — G311 Senile degeneration of brain, not elsewhere classified: Secondary | ICD-10-CM | POA: Diagnosis not present

## 2024-06-06 DIAGNOSIS — G20B2 Parkinson's disease with dyskinesia, with fluctuations: Secondary | ICD-10-CM | POA: Diagnosis not present

## 2024-06-06 DIAGNOSIS — R531 Weakness: Secondary | ICD-10-CM | POA: Diagnosis not present

## 2024-06-06 DIAGNOSIS — R296 Repeated falls: Secondary | ICD-10-CM | POA: Diagnosis not present

## 2024-06-06 DIAGNOSIS — I10 Essential (primary) hypertension: Secondary | ICD-10-CM | POA: Diagnosis not present

## 2024-06-30 DEATH — deceased
